# Patient Record
Sex: Female | Born: 1960 | Race: Black or African American | Hispanic: No | Marital: Married | Smoking: Never smoker
Health system: Southern US, Community
[De-identification: ages and names within clinical notes are randomized; demographics above are authoritative.]

## PROBLEM LIST (undated history)

## (undated) DIAGNOSIS — I5189 Other ill-defined heart diseases: Secondary | ICD-10-CM

## (undated) DIAGNOSIS — J3089 Other allergic rhinitis: Secondary | ICD-10-CM

## (undated) DIAGNOSIS — I10 Essential (primary) hypertension: Secondary | ICD-10-CM

## (undated) DIAGNOSIS — D763 Other histiocytosis syndromes: Secondary | ICD-10-CM

## (undated) DIAGNOSIS — E119 Type 2 diabetes mellitus without complications: Secondary | ICD-10-CM

## (undated) DIAGNOSIS — I517 Cardiomegaly: Secondary | ICD-10-CM

## (undated) DIAGNOSIS — D649 Anemia, unspecified: Secondary | ICD-10-CM

## (undated) DIAGNOSIS — L508 Other urticaria: Secondary | ICD-10-CM

## (undated) DIAGNOSIS — N926 Irregular menstruation, unspecified: Secondary | ICD-10-CM

## (undated) DIAGNOSIS — I480 Paroxysmal atrial fibrillation: Secondary | ICD-10-CM

## (undated) DIAGNOSIS — E041 Nontoxic single thyroid nodule: Secondary | ICD-10-CM

## (undated) DIAGNOSIS — E785 Hyperlipidemia, unspecified: Secondary | ICD-10-CM

## (undated) DIAGNOSIS — F329 Major depressive disorder, single episode, unspecified: Secondary | ICD-10-CM

## (undated) DIAGNOSIS — L509 Urticaria, unspecified: Secondary | ICD-10-CM

## (undated) DIAGNOSIS — R600 Localized edema: Secondary | ICD-10-CM

## (undated) DIAGNOSIS — Z87898 Personal history of other specified conditions: Secondary | ICD-10-CM

## (undated) DIAGNOSIS — I519 Heart disease, unspecified: Secondary | ICD-10-CM

## (undated) DIAGNOSIS — K219 Gastro-esophageal reflux disease without esophagitis: Secondary | ICD-10-CM

## (undated) DIAGNOSIS — E669 Obesity, unspecified: Secondary | ICD-10-CM

## (undated) DIAGNOSIS — F32A Depression, unspecified: Secondary | ICD-10-CM

## (undated) DIAGNOSIS — I509 Heart failure, unspecified: Secondary | ICD-10-CM

## (undated) DIAGNOSIS — D219 Benign neoplasm of connective and other soft tissue, unspecified: Secondary | ICD-10-CM

## (undated) DIAGNOSIS — N390 Urinary tract infection, site not specified: Secondary | ICD-10-CM

## (undated) HISTORY — DX: Urinary tract infection, site not specified: N39.0

## (undated) HISTORY — DX: Depression, unspecified: F32.A

## (undated) HISTORY — PX: MASS EXCISION: SHX2000

## (undated) HISTORY — DX: Irregular menstruation, unspecified: N92.6

## (undated) HISTORY — PX: TONSILLECTOMY AND ADENOIDECTOMY: SUR1326

## (undated) HISTORY — DX: Benign neoplasm of connective and other soft tissue, unspecified: D21.9

## (undated) HISTORY — PX: TUBAL LIGATION: SHX77

## (undated) HISTORY — DX: Major depressive disorder, single episode, unspecified: F32.9

## (undated) HISTORY — PX: COLONOSCOPY: SHX174

## (undated) HISTORY — DX: Essential (primary) hypertension: I10

---

## 1997-12-31 ENCOUNTER — Encounter: Admission: RE | Admit: 1997-12-31 | Discharge: 1998-03-31 | Payer: Self-pay | Admitting: Internal Medicine

## 1998-05-26 ENCOUNTER — Emergency Department (HOSPITAL_COMMUNITY): Admission: EM | Admit: 1998-05-26 | Discharge: 1998-05-26 | Payer: Self-pay | Admitting: Emergency Medicine

## 1998-05-26 ENCOUNTER — Encounter: Payer: Self-pay | Admitting: Emergency Medicine

## 1999-02-12 ENCOUNTER — Other Ambulatory Visit: Admission: RE | Admit: 1999-02-12 | Discharge: 1999-02-12 | Payer: Self-pay | Admitting: Obstetrics and Gynecology

## 1999-04-28 ENCOUNTER — Encounter: Admission: RE | Admit: 1999-04-28 | Discharge: 1999-07-27 | Payer: Self-pay | Admitting: Internal Medicine

## 1999-07-15 ENCOUNTER — Encounter: Payer: Self-pay | Admitting: Emergency Medicine

## 1999-07-15 ENCOUNTER — Emergency Department (HOSPITAL_COMMUNITY): Admission: EM | Admit: 1999-07-15 | Discharge: 1999-07-15 | Payer: Self-pay | Admitting: Emergency Medicine

## 1999-07-19 ENCOUNTER — Emergency Department (HOSPITAL_COMMUNITY): Admission: EM | Admit: 1999-07-19 | Discharge: 1999-07-19 | Payer: Self-pay | Admitting: Internal Medicine

## 1999-07-19 ENCOUNTER — Encounter: Payer: Self-pay | Admitting: Internal Medicine

## 1999-07-30 ENCOUNTER — Encounter: Admission: RE | Admit: 1999-07-30 | Discharge: 1999-08-17 | Payer: Self-pay | Admitting: Occupational Medicine

## 1999-08-21 ENCOUNTER — Encounter: Payer: Self-pay | Admitting: Gastroenterology

## 1999-08-21 ENCOUNTER — Ambulatory Visit (HOSPITAL_COMMUNITY): Admission: RE | Admit: 1999-08-21 | Discharge: 1999-08-21 | Payer: Self-pay | Admitting: Gastroenterology

## 1999-08-27 ENCOUNTER — Other Ambulatory Visit: Admission: RE | Admit: 1999-08-27 | Discharge: 1999-08-27 | Payer: Self-pay | Admitting: *Deleted

## 1999-09-10 ENCOUNTER — Ambulatory Visit (HOSPITAL_COMMUNITY): Admission: RE | Admit: 1999-09-10 | Discharge: 1999-09-10 | Payer: Self-pay | Admitting: *Deleted

## 1999-09-10 ENCOUNTER — Encounter: Payer: Self-pay | Admitting: *Deleted

## 1999-09-15 ENCOUNTER — Ambulatory Visit (HOSPITAL_COMMUNITY): Admission: RE | Admit: 1999-09-15 | Discharge: 1999-09-15 | Payer: Self-pay | Admitting: *Deleted

## 1999-09-15 ENCOUNTER — Encounter: Payer: Self-pay | Admitting: *Deleted

## 1999-09-28 ENCOUNTER — Encounter: Admission: RE | Admit: 1999-09-28 | Discharge: 1999-12-27 | Payer: Self-pay | Admitting: Internal Medicine

## 2000-09-05 ENCOUNTER — Other Ambulatory Visit: Admission: RE | Admit: 2000-09-05 | Discharge: 2000-09-05 | Payer: Self-pay | Admitting: Obstetrics and Gynecology

## 2000-09-27 ENCOUNTER — Encounter: Payer: Self-pay | Admitting: Gastroenterology

## 2000-09-27 ENCOUNTER — Ambulatory Visit (HOSPITAL_COMMUNITY): Admission: RE | Admit: 2000-09-27 | Discharge: 2000-09-27 | Payer: Self-pay | Admitting: Gastroenterology

## 2001-02-01 ENCOUNTER — Encounter: Payer: Self-pay | Admitting: Emergency Medicine

## 2001-02-01 ENCOUNTER — Emergency Department (HOSPITAL_COMMUNITY): Admission: EM | Admit: 2001-02-01 | Discharge: 2001-02-01 | Payer: Self-pay | Admitting: Emergency Medicine

## 2001-09-13 ENCOUNTER — Other Ambulatory Visit: Admission: RE | Admit: 2001-09-13 | Discharge: 2001-09-13 | Payer: Self-pay | Admitting: Obstetrics and Gynecology

## 2002-09-05 ENCOUNTER — Other Ambulatory Visit: Admission: RE | Admit: 2002-09-05 | Discharge: 2002-09-05 | Payer: Self-pay | Admitting: Obstetrics and Gynecology

## 2002-09-11 ENCOUNTER — Ambulatory Visit (HOSPITAL_COMMUNITY): Admission: RE | Admit: 2002-09-11 | Discharge: 2002-09-11 | Payer: Self-pay | Admitting: Obstetrics and Gynecology

## 2002-09-11 ENCOUNTER — Encounter: Payer: Self-pay | Admitting: Obstetrics and Gynecology

## 2003-10-15 ENCOUNTER — Other Ambulatory Visit: Admission: RE | Admit: 2003-10-15 | Discharge: 2003-10-15 | Payer: Self-pay | Admitting: Obstetrics and Gynecology

## 2003-10-30 ENCOUNTER — Ambulatory Visit (HOSPITAL_COMMUNITY): Admission: RE | Admit: 2003-10-30 | Discharge: 2003-10-30 | Payer: Self-pay | Admitting: Obstetrics and Gynecology

## 2004-07-30 ENCOUNTER — Emergency Department (HOSPITAL_COMMUNITY): Admission: EM | Admit: 2004-07-30 | Discharge: 2004-07-30 | Payer: Self-pay | Admitting: Family Medicine

## 2005-02-19 ENCOUNTER — Other Ambulatory Visit: Admission: RE | Admit: 2005-02-19 | Discharge: 2005-02-19 | Payer: Self-pay | Admitting: Obstetrics and Gynecology

## 2009-02-26 ENCOUNTER — Ambulatory Visit (HOSPITAL_COMMUNITY): Admission: RE | Admit: 2009-02-26 | Discharge: 2009-02-26 | Payer: Self-pay | Admitting: Family Medicine

## 2010-08-16 ENCOUNTER — Encounter: Payer: Self-pay | Admitting: Obstetrics and Gynecology

## 2011-04-09 ENCOUNTER — Ambulatory Visit (HOSPITAL_COMMUNITY)
Admission: RE | Admit: 2011-04-09 | Discharge: 2011-04-09 | Disposition: A | Discharge status: Home / Self Care | Payer: 59 | Source: Ambulatory Visit | Attending: Gastroenterology | Admitting: Gastroenterology

## 2011-04-09 DIAGNOSIS — K625 Hemorrhage of anus and rectum: Secondary | ICD-10-CM | POA: Insufficient documentation

## 2011-04-09 DIAGNOSIS — I10 Essential (primary) hypertension: Secondary | ICD-10-CM | POA: Insufficient documentation

## 2011-04-09 DIAGNOSIS — E119 Type 2 diabetes mellitus without complications: Secondary | ICD-10-CM | POA: Insufficient documentation

## 2011-04-09 DIAGNOSIS — K648 Other hemorrhoids: Secondary | ICD-10-CM | POA: Insufficient documentation

## 2012-02-25 ENCOUNTER — Encounter: Payer: Self-pay | Admitting: Obstetrics and Gynecology

## 2012-02-29 ENCOUNTER — Ambulatory Visit: Payer: Self-pay | Admitting: Obstetrics and Gynecology

## 2012-03-22 ENCOUNTER — Encounter: Payer: Self-pay | Admitting: Obstetrics and Gynecology

## 2012-03-22 ENCOUNTER — Ambulatory Visit (INDEPENDENT_AMBULATORY_CARE_PROVIDER_SITE_OTHER): Payer: 59 | Admitting: Obstetrics and Gynecology

## 2012-03-22 VITALS — BP 144/94 | Resp 16 | Ht 62.0 in

## 2012-03-22 DIAGNOSIS — Z01419 Encounter for gynecological examination (general) (routine) without abnormal findings: Secondary | ICD-10-CM

## 2012-03-22 DIAGNOSIS — N950 Postmenopausal bleeding: Secondary | ICD-10-CM

## 2012-03-22 DIAGNOSIS — Z124 Encounter for screening for malignant neoplasm of cervix: Secondary | ICD-10-CM

## 2012-03-22 NOTE — Progress Notes (Signed)
Regular Periods: no Mammogram: no  Monthly Breast Ex.: yes Exercise: yes  Tetanus < 10 years: yes Seatbelts: yes  NI. Bladder Functn.: yes Abuse at home: no  Daily BM's: yes Stressful Work: no  Healthy Diet: yes Sigmoid-Colonoscopy: "2011" WNL  Calcium: yes Medical problems this year: pt she has not had a cycle in 1 yr then yesterday she had 1 episode of bleeding. Pt is not bleeding anymore.   LAST PAP:09/2010  Contraception: BTL  Mammogram:  2010  ZOX:WRUEAVW Faulk , MD at [facility]   PMH: No Changes  FMH: No Changes  Last Bone Scan: per pt she does not remember.   Subjective:    Cynthia Walsh is a 51 y.o. female [phone] who presents for annual exam. The patient complains of postmenopausal bleeding. She has gone one year without a period.  She has hot flashes.  She is having trouble losing weight.  The following portions of the patient's history were reviewed and updated as appropriate: allergies, current medications, past family history, past medical history, past social history, past surgical history and problem list. See above.  Review of Systems Pertinent items are noted in HPI. Gastrointestinal:No change in bowel habits, no abdominal pain, no rectal bleeding Genitourinary:negative for dysuria, frequency, hematuria, nocturia and urinary incontinence    Objective:     BP 144/94  Resp 16  Ht 5\' 2"  (1.575 m)  Weight:  Wt Readings from Last 1 Encounters:  No data found for Wt     BMI: There is no weight on file to calculate BMI. General Appearance: Alert, appropriate appearance for age. No acute distress HEENT: Grossly normal Neck / Thyroid: Supple, no masses, nodes or enlargement Lungs: clear to auscultation bilaterally Back: No CVA tenderness Breast Exam: No masses or nodes.No dimpling, nipple retraction or discharge. Cardiovascular: Regular rate and rhythm. S1, S2, no murmur Gastrointestinal: Soft, non-tender, no masses or  organomegaly  ++++++++++++++++++++++++++++++++++++++++++++++++++++++++  Pelvic Exam: External genitalia: normal general appearance Vaginal: normal without tenderness, induration or masses and relaxation noted Cervix: normal appearance Adnexa: normal bimanual exam Uterus: normal single, nontender Rectovaginal: normal rectal, no masses  ++++++++++++++++++++++++++++++++++++++++++++++++++++++++  Lymphatic Exam: Non-palpable nodes in neck, clavicular, axillary, or inguinal regions  Psychiatric: Alert and oriented, appropriate affect.    Urinalysis:Not done      Assessment:    Normal gyn exam Post-menopausal vaginal bleeding   Overweight or obese: Yes  Pelvic relaxation: Yes  Menopausal symptoms: Yes. Severe: No.   Plan:    Mammogram. Pap smear.   Hydro-sonogram next visit and endometrial biopsy.  Follow-up:  4 weeks  STD screen request: GC, chlamydia,   The updated Pap smear screening guidelines were discussed with the patient. The patient requested that I obtain a Pap smear: Yes.  Kegel exercises discussed: Yes.  Proper diet and regular exercise were reviewed.  Annual mammograms recommended starting at age 31. Proper breast care was discussed.  Screening colonoscopy is recommended beginning at age 67.  Regular health maintenance was reviewed.  Sleep hygiene was discussed.  Adequate calcium and vitamin D intake was emphasized.  Cynthia Walsh.D.

## 2012-03-24 LAB — PAP IG, CT-NG, RFX HPV ASCU: Chlamydia Probe Amp: NEGATIVE

## 2012-04-19 ENCOUNTER — Ambulatory Visit (INDEPENDENT_AMBULATORY_CARE_PROVIDER_SITE_OTHER): Payer: 59 | Admitting: Obstetrics and Gynecology

## 2012-04-19 ENCOUNTER — Ambulatory Visit (INDEPENDENT_AMBULATORY_CARE_PROVIDER_SITE_OTHER): Payer: 59

## 2012-04-19 ENCOUNTER — Encounter: Payer: Self-pay | Admitting: Obstetrics and Gynecology

## 2012-04-19 ENCOUNTER — Other Ambulatory Visit: Payer: Self-pay | Admitting: Obstetrics and Gynecology

## 2012-04-19 VITALS — BP 142/94

## 2012-04-19 DIAGNOSIS — N898 Other specified noninflammatory disorders of vagina: Secondary | ICD-10-CM

## 2012-04-19 DIAGNOSIS — N950 Postmenopausal bleeding: Secondary | ICD-10-CM

## 2012-04-19 DIAGNOSIS — N939 Abnormal uterine and vaginal bleeding, unspecified: Secondary | ICD-10-CM

## 2012-04-19 DIAGNOSIS — N946 Dysmenorrhea, unspecified: Secondary | ICD-10-CM

## 2012-04-19 MED ORDER — IBUPROFEN 800 MG PO TABS: 800.0000 mg | ORAL_TABLET | Freq: Three times a day (TID) | ORAL | Status: DC | PRN

## 2012-04-19 NOTE — Progress Notes (Signed)
HISTORY OF PRESENT ILLNESS  Ms. Cynthia Walsh is a 51 y.o. year old female,G4P1031, who presents for a problem visit. The patient has a known history of fibroids.  Subjective:  She complains of postmenopausal bleeding.  Objective:  BP 142/94   General: no distress GI: soft and nontender  External genitalia: normal general appearance Vaginal: normal without tenderness, induration or masses and relaxation noted Cervix: normal appearance Adnexa: normal bimanual exam Uterus: nontender, difficult to outline because of obesity  Hydrosonogram:  Indications for the procedure were reviewed.  A permit has been signed.  Questions were answered. An ultrasound was performed.  The uterus measured 12.5 cm x 9.69 cm.  The endometrial thickness was 7.21 mm.  The ovaries : Left is normal, right not seen.  The vagina and cervix were prepped with Betadine.  Hurricaine gel was placed on the cervix.  A single-tooth tenaculum was used.  We attempted to sound the uterus for an endometrial biopsy.  The patient was uncomfortable and pelvic relaxation made the procedure difficult.  We were able to obtain a sample but I am not certain that it came from the endometrium. The hydrosonogram catheter was placed inside the uterus.  20 cc of sterile saline were injected.  A 3-D ultrasound was performed. Findings include: a posterior fibroid measuring 3.9 cm, 2 lesions consistent with polyps (2.5 and 1.0 cm).  The patient tolerated her procedure well.  All instruments were removed.  The patient was returned to the supine position.   Assessment:  Postmenopausal bleeding Fibroid uterus Endometrial polyps  Plan:  Management options were reviewed.  Risk and benefits were discussed.  The patient wants to proceed with hysteroscopy with dilatation and curettage. We will schedule. Endometrial biopsy to pathology. Ibuprofen 800 mg every 8 hours as needed for pain.  Return to office prn if symptoms worsen or fail to  improve.   Leonard Schwartz M.D.  04/19/2012 6:50 PM    When did bleeding start: started last month. How  Long: still bleeding  How often changing pad/tampon: not much maybe once-twice a day Bleeding Disorders: no Cramping: yes Contraception: no Fibroids: yes Hormone Therapy: no New Medications: yes Menopausal Symptoms: yes Vag. Discharge: no Abdominal Pain: yes "Only when its around the time for her cycle "would have been her cycle" Increased Stress: no

## 2012-04-21 LAB — PATHOLOGY

## 2012-04-24 ENCOUNTER — Telehealth: Payer: Self-pay | Admitting: Obstetrics and Gynecology

## 2012-04-24 NOTE — Telephone Encounter (Signed)
The patient called.  Told that biopsy showed benign endocervical cells.  Dr. Stefano Gaul

## 2012-05-10 ENCOUNTER — Telehealth: Payer: Self-pay | Admitting: Obstetrics and Gynecology

## 2012-05-10 NOTE — Telephone Encounter (Signed)
Hysteroscopy with Resection; D&C scheduled for 06/16/12 @ 11:00 with AVS.  UMR effective 07/27/11.  Plan pays 65/35 after a $400 deductible. Pre-cert not required. Pre-op due $81.49. -Adrianne Pridgen

## 2012-05-10 NOTE — Telephone Encounter (Signed)
Hysteroscopy with Resection; D&C rescheduled to 06/16/12 @ 1:00 with AVS.  UMR effective 07/27/11.  Plan pays 65/35 after a $400 deductible. Pre-cert not required. Pre-op due $81.49. -Adrianne Pridgen

## 2012-05-12 ENCOUNTER — Other Ambulatory Visit: Payer: Self-pay | Admitting: Obstetrics and Gynecology

## 2012-06-03 ENCOUNTER — Encounter (HOSPITAL_COMMUNITY): Payer: Self-pay | Admitting: Pharmacist

## 2012-06-14 ENCOUNTER — Telehealth: Payer: Self-pay | Admitting: Obstetrics and Gynecology

## 2012-06-14 ENCOUNTER — Encounter (HOSPITAL_COMMUNITY)
Admission: RE | Admit: 2012-06-14 | Discharge: 2012-06-14 | Disposition: A | Discharge status: Home / Self Care | Payer: 59 | Source: Ambulatory Visit | Attending: Obstetrics and Gynecology | Admitting: Obstetrics and Gynecology

## 2012-06-14 ENCOUNTER — Encounter (HOSPITAL_COMMUNITY): Payer: Self-pay

## 2012-06-14 DIAGNOSIS — Z01812 Encounter for preprocedural laboratory examination: Secondary | ICD-10-CM | POA: Insufficient documentation

## 2012-06-14 DIAGNOSIS — Z01818 Encounter for other preprocedural examination: Secondary | ICD-10-CM | POA: Insufficient documentation

## 2012-06-14 HISTORY — DX: Urticaria, unspecified: L50.9

## 2012-06-14 LAB — CBC
Platelets: 312 10*3/uL (ref 150–400)
RBC: 5.06 MIL/uL (ref 3.87–5.11)
RDW: 13.9 % (ref 11.5–15.5)
WBC: 6.2 10*3/uL (ref 4.0–10.5)

## 2012-06-14 LAB — BASIC METABOLIC PANEL
CO2: 28 mEq/L (ref 19–32)
Chloride: 99 mEq/L (ref 96–112)
Creatinine, Ser: 0.87 mg/dL (ref 0.50–1.10)
GFR calc Af Amer: 87 mL/min — ABNORMAL LOW (ref >90)
Potassium: 3.6 mEq/L (ref 3.5–5.1)
Sodium: 135 mEq/L (ref 135–145)

## 2012-06-14 NOTE — Pre-Procedure Instructions (Signed)
Dr. Malen Gauze notified of blood glucose of 226, call to Adrianne /MD office to notify Dr Stefano Gaul.

## 2012-06-14 NOTE — Patient Instructions (Addendum)
20 SOPHEA RACKHAM  06/14/2012   Your procedure is scheduled on:  06/16/12  Enter through the Main Entrance of [facility] at 1130 AM.  Pick up the phone at the desk and dial [phone].   Call this number if you have problems the morning of surgery: [phone]   Remember:   Do not eat food:after midnight  Do not drink clear liquids: 4 Hours before arrival.  Take these medicines the morning of surgery with A SIP OF WATER: NA   Do not wear jewelry, make-up or nail polish.  Do not wear lotions, powders, or perfumes. You may wear deodorant.  Do not shave 48 hours prior to surgery.  Do not bring valuables to the hospital.  Contacts, dentures or bridgework may not be worn into surgery.  Leave suitcase in the car. After surgery it may be brought to your room.  For patients admitted to the hospital, checkout time is 11:00 AM the day of discharge.   Patients discharged the day of surgery will not be allowed to drive home.  Name and phone number of your driver: husband or sister  Special Instructions: Shower using CHG 2 nights before surgery and the night before surgery.  If you shower the day of surgery use CHG.  Use special wash - you have one bottle of CHG for all showers.  You should use approximately 1/3 of the bottle for each shower.   Please read over the following fact sheets that you were given: Surgical Site Infection Prevention

## 2012-06-14 NOTE — Pre-Procedure Instructions (Signed)
EKG reviewed by Dr. Malen Gauze, patient to have cardiac clearance prior to surgery. Pt made aware, Dr. Debria Garret office notified Merita Norton).

## 2012-06-15 ENCOUNTER — Other Ambulatory Visit (HOSPITAL_COMMUNITY): Payer: Self-pay | Admitting: Cardiology

## 2012-06-15 DIAGNOSIS — Z01818 Encounter for other preprocedural examination: Secondary | ICD-10-CM

## 2012-06-16 ENCOUNTER — Ambulatory Visit (HOSPITAL_COMMUNITY): Admission: RE | Admit: 2012-06-16 | Payer: 59 | Source: Ambulatory Visit | Admitting: Obstetrics and Gynecology

## 2012-06-16 ENCOUNTER — Other Ambulatory Visit (HOSPITAL_COMMUNITY): Payer: Self-pay | Admitting: *Deleted

## 2012-06-16 ENCOUNTER — Encounter (HOSPITAL_COMMUNITY): Admission: RE | Payer: Self-pay | Source: Ambulatory Visit

## 2012-06-16 DIAGNOSIS — R9431 Abnormal electrocardiogram [ECG] [EKG]: Secondary | ICD-10-CM

## 2012-06-16 DIAGNOSIS — R011 Cardiac murmur, unspecified: Secondary | ICD-10-CM

## 2012-06-16 SURGERY — DILATATION & CURETTAGE/HYSTEROSCOPY WITH RESECTOCOPE
Anesthesia: Choice

## 2012-06-27 ENCOUNTER — Encounter (HOSPITAL_COMMUNITY)
Admission: RE | Admit: 2012-06-27 | Discharge: 2012-06-27 | Disposition: A | Discharge status: Home / Self Care | Payer: 59 | Source: Ambulatory Visit | Attending: Cardiology | Admitting: Cardiology

## 2012-06-27 VITALS — BP 178/90

## 2012-06-27 DIAGNOSIS — Z01818 Encounter for other preprocedural examination: Secondary | ICD-10-CM

## 2012-06-27 DIAGNOSIS — R9431 Abnormal electrocardiogram [ECG] [EKG]: Secondary | ICD-10-CM | POA: Diagnosis present

## 2012-06-28 ENCOUNTER — Encounter (HOSPITAL_COMMUNITY)
Admission: RE | Admit: 2012-06-28 | Discharge: 2012-06-28 | Disposition: A | Discharge status: Home / Self Care | Payer: 59 | Source: Ambulatory Visit | Attending: Cardiology | Admitting: Cardiology

## 2012-06-28 DIAGNOSIS — Z01818 Encounter for other preprocedural examination: Secondary | ICD-10-CM | POA: Insufficient documentation

## 2012-06-28 DIAGNOSIS — R9431 Abnormal electrocardiogram [ECG] [EKG]: Secondary | ICD-10-CM | POA: Insufficient documentation

## 2012-06-28 DIAGNOSIS — R011 Cardiac murmur, unspecified: Secondary | ICD-10-CM | POA: Insufficient documentation

## 2012-06-28 MED ORDER — TECHNETIUM TC 99M SESTAMIBI GENERIC - CARDIOLITE
30.0000 | Freq: Once | INTRAVENOUS | Status: AC | PRN
  Administered 2012-06-28: 30 via INTRAVENOUS

## 2012-06-28 MED ORDER — TECHNETIUM TC 99M SESTAMIBI - CARDIOLITE
30.0000 | Freq: Once | INTRAVENOUS | Status: AC | PRN
  Administered 2012-06-27: 10:00:00 30 via INTRAVENOUS

## 2012-06-29 ENCOUNTER — Ambulatory Visit (HOSPITAL_COMMUNITY)
Admission: RE | Admit: 2012-06-29 | Discharge: 2012-06-29 | Disposition: A | Discharge status: Home / Self Care | Payer: 59 | Source: Ambulatory Visit | Attending: Cardiology | Admitting: Cardiology

## 2012-06-29 ENCOUNTER — Encounter: Payer: 59 | Admitting: Obstetrics and Gynecology

## 2012-06-29 DIAGNOSIS — I10 Essential (primary) hypertension: Secondary | ICD-10-CM | POA: Insufficient documentation

## 2012-06-29 DIAGNOSIS — R011 Cardiac murmur, unspecified: Secondary | ICD-10-CM | POA: Insufficient documentation

## 2012-06-29 DIAGNOSIS — R9431 Abnormal electrocardiogram [ECG] [EKG]: Secondary | ICD-10-CM

## 2012-06-29 NOTE — Progress Notes (Signed)
  Echocardiogram 2D Echocardiogram has been performed.  Cynthia Walsh 06/29/2012, 2:41 PM

## 2012-07-04 ENCOUNTER — Encounter: Payer: Self-pay | Admitting: Obstetrics and Gynecology

## 2012-08-02 ENCOUNTER — Other Ambulatory Visit (HOSPITAL_COMMUNITY): Payer: Self-pay | Admitting: Family Medicine

## 2012-08-02 DIAGNOSIS — Z1231 Encounter for screening mammogram for malignant neoplasm of breast: Secondary | ICD-10-CM

## 2012-08-30 ENCOUNTER — Ambulatory Visit (HOSPITAL_COMMUNITY)
Admission: RE | Admit: 2012-08-30 | Discharge: 2012-08-30 | Disposition: A | Discharge status: Home / Self Care | Payer: 59 | Source: Ambulatory Visit | Attending: Family Medicine | Admitting: Family Medicine

## 2012-08-30 DIAGNOSIS — Z780 Asymptomatic menopausal state: Secondary | ICD-10-CM | POA: Insufficient documentation

## 2012-08-30 DIAGNOSIS — Z1231 Encounter for screening mammogram for malignant neoplasm of breast: Secondary | ICD-10-CM | POA: Insufficient documentation

## 2012-08-30 DIAGNOSIS — Z13820 Encounter for screening for osteoporosis: Secondary | ICD-10-CM | POA: Insufficient documentation

## 2012-08-31 ENCOUNTER — Other Ambulatory Visit: Payer: Self-pay | Admitting: Family Medicine

## 2012-08-31 DIAGNOSIS — R928 Other abnormal and inconclusive findings on diagnostic imaging of breast: Secondary | ICD-10-CM

## 2012-09-08 ENCOUNTER — Ambulatory Visit (HOSPITAL_BASED_OUTPATIENT_CLINIC_OR_DEPARTMENT_OTHER): Payer: 59 | Attending: Family Medicine

## 2012-09-08 VITALS — Ht 62.0 in | Wt 225.0 lb

## 2012-09-08 DIAGNOSIS — G4733 Obstructive sleep apnea (adult) (pediatric): Secondary | ICD-10-CM

## 2012-09-08 DIAGNOSIS — G4730 Sleep apnea, unspecified: Secondary | ICD-10-CM | POA: Insufficient documentation

## 2012-09-08 DIAGNOSIS — G4710 Hypersomnia, unspecified: Secondary | ICD-10-CM | POA: Insufficient documentation

## 2012-09-09 DIAGNOSIS — R0609 Other forms of dyspnea: Secondary | ICD-10-CM

## 2012-09-09 DIAGNOSIS — G4710 Hypersomnia, unspecified: Secondary | ICD-10-CM

## 2012-09-09 DIAGNOSIS — R0989 Other specified symptoms and signs involving the circulatory and respiratory systems: Secondary | ICD-10-CM

## 2012-09-09 DIAGNOSIS — G4730 Sleep apnea, unspecified: Secondary | ICD-10-CM

## 2012-09-12 NOTE — Procedures (Signed)
NAME:  Cynthia Walsh, Cynthia Walsh             ACCOUNT NO.:  [id number]  MEDICAL RECORD NO.:  [id number]          PATIENT TYPE:  OUT  LOCATION:  SLEEP CENTER                 FACILITY:  [facility]  PHYSICIAN:  Clinton D. Maple Hudson, MD, FCCP, FACPDATE OF BIRTH:  07-03-[birth year]  DATE OF STUDY:  09/08/2012                           NOCTURNAL POLYSOMNOGRAM  REFERRING PHYSICIAN:  Cammie Fulp, MD  INDICATION FOR STUDY:  Hypersomnia with sleep apnea.  EPWORTH SLEEPINESS SCORE:  2/24.  BMI 41.2, weight 225 pounds, height 62 inches, neck 14 inches.  MEDICATIONS:  Home medications are charted and reviewed.  SLEEP ARCHITECTURE:  Total sleep time 324 minutes with sleep efficiency 81.7%.  Stage I was 5.9%, stage II 90.9%, stage III 3.2%, REM absent. Sleep latency 5 minutes, awake after sleep onset 50 minutes, arousal index 7.4.  BEDTIME MEDICATION:  None.  RESPIRATORY DATA:  Apnea-hypopnea index (AHI) 4.6 per hour.  A total of 25 events was scored including 11 obstructive apneas and 14 hypopneas. Most events were nonsupine.  There were not enough events to meet protocol requirements for CPAP titration on this night.  OXYGEN DATA:  Loud snoring with oxygen desaturation to a nadir of 77% and mean oxygen saturation through the study of 92.7% on room air.  CARDIAC DATA:  Normal sinus rhythm.  MOVEMENT-PARASOMNIA:  No significant movement disturbance.  No bathroom trips.  IMPRESSIONS-RECOMMENDATIONS: 1. Sleep architecture, remarkable for absence of REM, which is a     nonspecific finding on a single night in an unfamiliar environment.     No bedtime medication was taken. 2. Occasional respiratory events with sleep disturbance, within normal     limits.  AHI 4.6 per hour (the normal range for adults is from 0-5     events per hour).  Loud snoring with oxygen desaturation to a nadir     of 77% and mean oxygen saturation through the study of 92.7% on     room air. 3. Apnea events were tightly clustered between 3 and  4 a.m.  It may be     that intervals with sleep apnea are being noted in the home     environment, but sustained apneic respiratory pattern was not seen     on the study night.     Therapeutic emphasis might be placed on weight loss and treatment     for any significant nasal or upper airway obstruction, such as     rhinitis, if appropriate.     Clinton D. Maple Hudson, MD, [facility], FACP Diplomate, American Board of Sleep Medicine    CDY/MEDQ  D:  09/09/2012 10:35:53  T:  09/09/2012 21:26:05  Job:  [id number]

## 2012-09-13 ENCOUNTER — Ambulatory Visit
Admission: RE | Admit: 2012-09-13 | Discharge: 2012-09-13 | Disposition: A | Discharge status: Home / Self Care | Payer: 59 | Source: Ambulatory Visit | Attending: Family Medicine | Admitting: Family Medicine

## 2012-09-13 DIAGNOSIS — R928 Other abnormal and inconclusive findings on diagnostic imaging of breast: Secondary | ICD-10-CM

## 2012-09-15 ENCOUNTER — Encounter (HOSPITAL_BASED_OUTPATIENT_CLINIC_OR_DEPARTMENT_OTHER): Payer: 59

## 2013-05-10 ENCOUNTER — Other Ambulatory Visit: Payer: Self-pay | Admitting: Obstetrics and Gynecology

## 2013-06-04 ENCOUNTER — Encounter (HOSPITAL_COMMUNITY): Payer: Self-pay | Admitting: Pharmacist

## 2013-06-12 ENCOUNTER — Encounter (HOSPITAL_COMMUNITY)
Admission: RE | Admit: 2013-06-12 | Discharge: 2013-06-12 | Disposition: A | Discharge status: Home / Self Care | Payer: 59 | Source: Ambulatory Visit | Attending: Obstetrics and Gynecology | Admitting: Obstetrics and Gynecology

## 2013-06-12 ENCOUNTER — Encounter (HOSPITAL_COMMUNITY): Payer: Self-pay

## 2013-06-12 LAB — CBC
Hemoglobin: 12.9 g/dL (ref 12.0–15.0)
MCH: 25.9 pg — ABNORMAL LOW (ref 26.0–34.0)
MCHC: 33.4 g/dL (ref 30.0–36.0)
MCV: 77.4 fL — ABNORMAL LOW (ref 78.0–100.0)
Platelets: 307 10*3/uL (ref 150–400)

## 2013-06-12 NOTE — Patient Instructions (Signed)
20 Cynthia Walsh  06/12/2013   Your procedure is scheduled on:  06/15/13  Enter through the Main Entrance of [facility] at 730 AM.  Pick up the phone at the desk and dial [phone].   Call this number if you have problems the morning of surgery: [phone]   Remember:   Do not eat food:After Midnight.  Do not drink clear liquids: After Midnight.  Take these medicines the morning of surgery with A SIP OF WATER: Blood pressure medication, hold Metformin 24hrs prior to surgery   Do not wear jewelry, make-up or nail polish.  Do not wear lotions, powders, or perfumes. You may wear deodorant.  Do not shave 48 hours prior to surgery.  Do not bring valuables to the hospital.  [facility] is not   responsible for any belongings or valuables brought to the hospital.  Contacts, dentures or bridgework may not be worn into surgery.  Leave suitcase in the car. After surgery it may be brought to your room.  For patients admitted to the hospital, checkout time is 11:00 AM the day of              discharge.   Patients discharged the day of surgery will not be allowed to drive             home.  Name and phone number of your driver: Georga Hacking  spouse  Special Instructions:   Shower using CHG 2 nights before surgery and the night before surgery.  If you shower the day of surgery use CHG.  Use special wash - you have one bottle of CHG for all showers.  You should use approximately 1/3 of the bottle for each shower.   Please read over the following fact sheets that you were given:   Surgical Site Infection Prevention

## 2013-06-12 NOTE — Pre-Procedure Instructions (Signed)
Pt  Hx and EKG reviewed by Sherron Ales, MD. No orders given.

## 2013-06-14 NOTE — H&P (Signed)
  Admission History and Physical Exam for a Gynecology Patient  Ms. MYLEIGH AMARA is a 52 y.o. female, [phone], who presents for hysteroscopy with dilatation and curettage.  The patient has a history of postmenopausal bleeding.  She was scheduled for the same procedure one year ago.  She was noted to have EKG changes in her procedure was canceled.  The patient did not followup in our office until recently. She has been followed at the [facility] and Gynecology division of [facility].  OB History   Grav Para Term Preterm Abortions TAB SAB Ect Mult Living   4 1 1  3  3   1       Past Medical History  Diagnosis Date  . Depression   . Fibroid   . Hypertension   . Irregular bleeding   . UTI (urinary tract infection)     history of UTI   . Full body hives     hx 2012    No prescriptions prior to admission    Past Surgical History  Procedure Laterality Date  . Tubal ligation      Allergies  Allergen Reactions  . Ceclor [Cefaclor] Nausea And Vomiting  . Sulfa Drugs Cross Reactors Itching, Swelling and Rash  . Fruit & Vegetable Daily [Nutritional Supplements] Rash    Family History: family history includes Alzheimer's disease in her father and mother.  Social History:  reports that she has never smoked. She has never used smokeless tobacco. She reports that she does not drink alcohol or use illicit drugs.  Review of systems: See HPI.  Admission Physical Exam:    BMI = 47.  There were no vitals taken for this visit.  HEENT:                 Within normal limits Chest:                   Clear Heart:                    Regular rate and rhythm Breasts:                No masses, skin changes, bleeding, or discharge present Abdomen:             Nontender, no masses Extremities:          Grossly normal Neurologic exam: Grossly normal  Pelvic exam:  External genitalia: normal general appearance Vaginal: normal without tenderness,  induration or masses Cervix: normal appearance Adnexa: normal bimanual exam Uterus: difficult to outline Rectal: no masses  Assessment:  Postmenopausal bleeding Hypertension Obesity (BMI 47) Depression Fibroids  Plan:  The patient will undergo hysteroscopy with dilatation and curettage.  She understands the indications for surgical procedure.  She accepts the risk of, but not limited to, anesthetic complications, bleeding, infections, and possible damage to the surrounding organs.   Janine Limbo 06/14/2013

## 2013-06-15 ENCOUNTER — Ambulatory Visit (HOSPITAL_COMMUNITY)
Admission: RE | Admit: 2013-06-15 | Discharge: 2013-06-15 | Disposition: A | Discharge status: Home / Self Care | Payer: 59 | Source: Ambulatory Visit | Attending: Obstetrics and Gynecology | Admitting: Obstetrics and Gynecology

## 2013-06-15 ENCOUNTER — Encounter (HOSPITAL_COMMUNITY)
Admission: RE | Disposition: A | Discharge status: Home / Self Care | Payer: Self-pay | Source: Ambulatory Visit | Attending: Obstetrics and Gynecology

## 2013-06-15 ENCOUNTER — Encounter (HOSPITAL_COMMUNITY): Payer: 59 | Admitting: Anesthesiology

## 2013-06-15 ENCOUNTER — Ambulatory Visit (HOSPITAL_COMMUNITY): Payer: 59 | Admitting: Anesthesiology

## 2013-06-15 ENCOUNTER — Encounter (HOSPITAL_COMMUNITY): Payer: Self-pay

## 2013-06-15 DIAGNOSIS — D259 Leiomyoma of uterus, unspecified: Secondary | ICD-10-CM | POA: Insufficient documentation

## 2013-06-15 DIAGNOSIS — I10 Essential (primary) hypertension: Secondary | ICD-10-CM | POA: Insufficient documentation

## 2013-06-15 DIAGNOSIS — E669 Obesity, unspecified: Secondary | ICD-10-CM | POA: Insufficient documentation

## 2013-06-15 DIAGNOSIS — N950 Postmenopausal bleeding: Secondary | ICD-10-CM | POA: Insufficient documentation

## 2013-06-15 DIAGNOSIS — E119 Type 2 diabetes mellitus without complications: Secondary | ICD-10-CM | POA: Insufficient documentation

## 2013-06-15 DIAGNOSIS — F329 Major depressive disorder, single episode, unspecified: Secondary | ICD-10-CM | POA: Insufficient documentation

## 2013-06-15 DIAGNOSIS — F3289 Other specified depressive episodes: Secondary | ICD-10-CM | POA: Insufficient documentation

## 2013-06-15 DIAGNOSIS — N840 Polyp of corpus uteri: Secondary | ICD-10-CM | POA: Insufficient documentation

## 2013-06-15 DIAGNOSIS — Z6841 Body Mass Index (BMI) 40.0 and over, adult: Secondary | ICD-10-CM | POA: Insufficient documentation

## 2013-06-15 HISTORY — DX: Type 2 diabetes mellitus without complications: E11.9

## 2013-06-15 HISTORY — PX: DILATATION & CURRETTAGE/HYSTEROSCOPY WITH RESECTOCOPE: SHX5572

## 2013-06-15 LAB — BASIC METABOLIC PANEL
Calcium: 9.3 mg/dL (ref 8.4–10.5)
Creatinine, Ser: 0.8 mg/dL (ref 0.50–1.10)
GFR calc non Af Amer: 83 mL/min — ABNORMAL LOW (ref >90)
Glucose, Bld: 172 mg/dL — ABNORMAL HIGH (ref 70–99)
Sodium: 137 mEq/L (ref 135–145)

## 2013-06-15 LAB — GLUCOSE, CAPILLARY
Glucose-Capillary: 165 mg/dL — ABNORMAL HIGH (ref 70–99)
Glucose-Capillary: 149 mg/dL — ABNORMAL HIGH (ref 70–99)

## 2013-06-15 SURGERY — DILATATION & CURETTAGE/HYSTEROSCOPY WITH RESECTOCOPE
Anesthesia: General

## 2013-06-15 MED ORDER — KETOROLAC TROMETHAMINE 30 MG/ML IJ SOLN
INTRAMUSCULAR | Status: DC | PRN
  Administered 2013-06-15: 30 mg via INTRAVENOUS
  Administered 2013-06-15: 30 mg via INTRAMUSCULAR

## 2013-06-15 MED ORDER — BUPIVACAINE-EPINEPHRINE (PF) 0.5% -1:200000 IJ SOLN
INTRAMUSCULAR | Status: AC
  Filled 2013-06-15: qty 10

## 2013-06-15 MED ORDER — METOCLOPRAMIDE HCL 5 MG/ML IJ SOLN: 10.0000 mg | Freq: Once | INTRAMUSCULAR | Status: DC | PRN

## 2013-06-15 MED ORDER — GLYCINE 1.5 % IR SOLN
Status: DC | PRN
  Administered 2013-06-15: 3000 mL

## 2013-06-15 MED ORDER — ONDANSETRON HCL 4 MG/2ML IJ SOLN
INTRAMUSCULAR | Status: AC
  Filled 2013-06-15: qty 2

## 2013-06-15 MED ORDER — ONDANSETRON HCL 4 MG/2ML IJ SOLN
INTRAMUSCULAR | Status: DC | PRN
  Administered 2013-06-15: 4 mg via INTRAVENOUS

## 2013-06-15 MED ORDER — LACTATED RINGERS IV SOLN
INTRAVENOUS | Status: DC
  Administered 2013-06-15: 50 mL/h via INTRAVENOUS

## 2013-06-15 MED ORDER — LIDOCAINE HCL (CARDIAC) 20 MG/ML IV SOLN
INTRAVENOUS | Status: DC | PRN
  Administered 2013-06-15: 30 mg via INTRAVENOUS

## 2013-06-15 MED ORDER — LIDOCAINE HCL (CARDIAC) 20 MG/ML IV SOLN
INTRAVENOUS | Status: AC
Start: 2013-06-15 — End: 2013-06-15
  Filled 2013-06-15: qty 5

## 2013-06-15 MED ORDER — PROPOFOL 10 MG/ML IV BOLUS
INTRAVENOUS | Status: DC | PRN
  Administered 2013-06-15: 170 mg via INTRAVENOUS
  Administered 2013-06-15: 80 mg via INTRAVENOUS

## 2013-06-15 MED ORDER — KETOROLAC TROMETHAMINE 30 MG/ML IJ SOLN: 15.0000 mg | Freq: Once | INTRAMUSCULAR | Status: DC | PRN

## 2013-06-15 MED ORDER — PROPOFOL 10 MG/ML IV EMUL
INTRAVENOUS | Status: AC
  Filled 2013-06-15: qty 40

## 2013-06-15 MED ORDER — FENTANYL CITRATE 0.05 MG/ML IJ SOLN: 25.0000 ug | INTRAMUSCULAR | Status: DC | PRN

## 2013-06-15 MED ORDER — MEPERIDINE HCL 25 MG/ML IJ SOLN: 6.2500 mg | INTRAMUSCULAR | Status: DC | PRN

## 2013-06-15 MED ORDER — FENTANYL CITRATE 0.05 MG/ML IJ SOLN
INTRAMUSCULAR | Status: AC
  Filled 2013-06-15: qty 2

## 2013-06-15 MED ORDER — BUPIVACAINE-EPINEPHRINE 0.5% -1:200000 IJ SOLN
INTRAMUSCULAR | Status: DC | PRN
  Administered 2013-06-15: 10 mL

## 2013-06-15 MED ORDER — KETOROLAC TROMETHAMINE 30 MG/ML IJ SOLN
INTRAMUSCULAR | Status: AC
  Filled 2013-06-15: qty 2

## 2013-06-15 MED ORDER — FENTANYL CITRATE 0.05 MG/ML IJ SOLN
INTRAMUSCULAR | Status: DC | PRN
  Administered 2013-06-15: 50 ug via INTRAVENOUS

## 2013-06-15 MED ORDER — MIDAZOLAM HCL 2 MG/2ML IJ SOLN
INTRAMUSCULAR | Status: DC | PRN
  Administered 2013-06-15: 2 mg via INTRAVENOUS

## 2013-06-15 MED ORDER — MIDAZOLAM HCL 2 MG/2ML IJ SOLN
INTRAMUSCULAR | Status: AC
  Filled 2013-06-15: qty 2

## 2013-06-15 SURGICAL SUPPLY — 18 items
CANISTER SUCT 3000ML (MISCELLANEOUS) ×2 IMPLANT
CATH ROBINSON RED A/P 16FR (CATHETERS) ×3 IMPLANT
CLOTH BEACON ORANGE TIMEOUT ST (SAFETY) ×2 IMPLANT
CONTAINER PREFILL 10% NBF 60ML (FORM) ×4 IMPLANT
DRAPE HYSTEROSCOPY (DRAPE) ×1 IMPLANT
DRESSING TELFA 8X3 (GAUZE/BANDAGES/DRESSINGS) ×2 IMPLANT
ELECT REM PT RETURN 9FT ADLT (ELECTROSURGICAL)
ELECTRODE REM PT RTRN 9FT ADLT (ELECTROSURGICAL) IMPLANT
GLOVE BIOGEL PI IND STRL 8.5 (GLOVE) ×1 IMPLANT
GLOVE BIOGEL PI INDICATOR 8.5 (GLOVE) ×1
GLOVE ECLIPSE 8.0 STRL XLNG CF (GLOVE) ×4 IMPLANT
GLYCINE 1.5% IRRIG UROMATIC (IV SOLUTION) ×1 IMPLANT
GOWN STRL REIN XL XLG (GOWN DISPOSABLE) ×4 IMPLANT
LOOP ANGLED CUTTING 22FR (CUTTING LOOP) ×1 IMPLANT
PACK HYSTEROSCOPY LF (CUSTOM PROCEDURE TRAY) ×2 IMPLANT
PAD OB MATERNITY 4.3X12.25 (PERSONAL CARE ITEMS) ×2 IMPLANT
TOWEL OR 17X24 6PK STRL BLUE (TOWEL DISPOSABLE) ×4 IMPLANT
WATER STERILE IRR 1000ML POUR (IV SOLUTION) ×2 IMPLANT

## 2013-06-15 NOTE — H&P (Signed)
The patient was interviewed and examined today.  The previously documented history and physical examination was reviewed. There are no changes. The operative procedure was reviewed. The risks and benefits were outlined again. The specific risks include, but are not limited to, anesthetic complications, bleeding, infections, and possible damage to the surrounding organs. The patient's questions were answered.  We are ready to proceed as outlined. The likelihood of the patient achieving the goals of this procedure is very likely.   BP 156/104  Pulse 92  Temp(Src) 98.1 F (36.7 C) (Oral)  Resp 20  SpO2 97%  Results for orders placed during the hospital encounter of 06/15/13 (from the past 24 hour(s))  BASIC METABOLIC PANEL     Status: Abnormal   Collection Time    06/15/13  7:49 AM      Result Value Range   Sodium 137  135 - 145 mEq/L   Potassium 3.8  3.5 - 5.1 mEq/L   Chloride 102  96 - 112 mEq/L   CO2 24  19 - 32 mEq/L   Glucose, Bld 172 (*) 70 - 99 mg/dL   BUN 12  6 - 23 mg/dL   Creatinine, Ser 7.82  0.50 - 1.10 mg/dL   Calcium 9.3  8.4 - 95.6 mg/dL   GFR calc non Af Amer 83 (*) >90 mL/min   GFR calc Af Amer >90  >90 mL/min  GLUCOSE, CAPILLARY     Status: Abnormal   Collection Time    06/15/13  8:36 AM      Result Value Range   Glucose-Capillary 165 (*) 70 - 99 mg/dL   Comment 1 Documented in Chart     Comment 2 Notify RN     CBC    Component Value Date/Time   WBC 7.1 06/12/2013 0910   RBC 4.99 06/12/2013 0910   HGB 12.9 06/12/2013 0910   HCT 38.6 06/12/2013 0910   PLT 307 06/12/2013 0910   MCV 77.4* 06/12/2013 0910   MCH 25.9* 06/12/2013 0910   MCHC 33.4 06/12/2013 0910   RDW 13.8 06/12/2013 0910    Leonard Schwartz, M.D.

## 2013-06-15 NOTE — Anesthesia Preprocedure Evaluation (Addendum)
Anesthesia Evaluation  Patient identified by MRN, date of birth, ID band Patient awake    Reviewed: Allergy & Precautions, H&P , NPO status , Patient's Chart, lab work & pertinent test results, reviewed documented beta blocker date and time   History of Anesthesia Complications Negative for: history of anesthetic complications  Airway Mallampati: III TM Distance: >3 FB Neck ROM: full  Mouth opening: Limited Mouth Opening  Dental  (+) Teeth Intact   Pulmonary  allergies breath sounds clear to auscultation  Pulmonary exam normal       Cardiovascular Exercise Tolerance: Good hypertension, On Medications Rhythm:regular Rate:Normal     Neuro/Psych Depression negative neurological ROS     GI/Hepatic negative GI ROS, Neg liver ROS,   Endo/Other  diabetes, Type 2, Oral Hypoglycemic AgentsMorbid obesity  Renal/GU negative Renal ROS  Female GU complaint     Musculoskeletal   Abdominal   Peds  Hematology negative hematology ROS (+)   Anesthesia Other Findings   Reproductive/Obstetrics negative OB ROS                          Anesthesia Physical Anesthesia Plan  ASA: III  Anesthesia Plan: General LMA   Post-op Pain Management:    Induction:   Airway Management Planned:   Additional Equipment:   Intra-op Plan:   Post-operative Plan:   Informed Consent: I have reviewed the patients History and Physical, chart, labs and discussed the procedure including the risks, benefits and alternatives for the proposed anesthesia with the patient or authorized representative who has indicated his/her understanding and acceptance.   Dental Advisory Given  Plan Discussed with: CRNA and Surgeon  Anesthesia Plan Comments:         Anesthesia Quick Evaluation

## 2013-06-15 NOTE — Op Note (Signed)
OPERATIVE NOTE  Cynthia Walsh  DOB:    03-24-1961  MRN:    [id number]  CSN:    [id number]  Date of Surgery:  06/15/2013  Preoperative Diagnosis:  Postmenopausal bleeding Obesity Hypertension Diabetes Anemia Depression  Postoperative Diagnosis:  Same Endometrial polyps  Procedure:  Hysteroscopy with resection of endometrial polyps Dilatation and curettage  Surgeon:  Leonard Schwartz, M.D.  Assistant:  None  Anesthetic:  General  Disposition:  The patient is a 52 y.o.-year-old female who presents with postmenopausal bleeding. She understands the indications for her surgical procedure. She accepts the risk of, but not limited to, anesthetic complications, bleeding, infections, and possible damage to the surrounding organs.  Findings:  On examination under anesthesia the uterus was 6-8 weeks size. No adnexal masses were appreciated. No parametrial disease was appreciated. The uterus sounded to 8 cm. The patient was noted to have 2 endometrial polyps with the largest polyp measuring approximately 2 cm.  Procedure:  The patient was taken to the operating room where a general anesthetic was given. The perineum and vagina were prepped with Betadine. The bladder was drained of urine. The patient was sterilely draped. Examination under anesthesia was performed. A paracervical block was placed using 10 cc of half percent Marcaine with epinephrine. An endocervical curettage was performed. The cervix was gently dilated. The diagnostic hysteroscope was inserted and the cavity was carefully inspected. Pictures were taken. Findings included: Endometrial polyps. The diagnostic hysteroscope was removed. The cervix was dilated further. The operative hysteroscope was inserted. The polyps were resected using a single loop. The cavity was then curetted using a sharp curet. The cavity was felt to be clean at the end of our procedure. Hemostasis was adequate. All instruments were  removed. The examination was repeated and the uterus was noted to be firm. Sponge, and needle counts were correct. The estimated blood loss for the procedure was 20 cc. The estimated fluid deficit loss 125 cc. The patient was awakened from her anesthetic without difficulty. She was returned to the supine position and and transported to the recovery room in stable condition. The endocervical curettings, endometrial resections, and endometrial curettings were sent to pathology.  Followup instructions:  The patient will return to see Dr. Stefano Gaul in 2 weeks. She was given a copy of the postoperative instructions for patients who've undergone hysteroscopy.  Discharge medications:  Motrin 800 mg every 8 hours as needed for mild to moderate pain.  Leonard Schwartz, M.D.

## 2013-06-15 NOTE — Transfer of Care (Signed)
Immediate Anesthesia Transfer of Care Note  Patient: Cynthia Walsh  Procedure(s) Performed: Procedure(s): DILATATION & CURETTAGE/HYSTEROSCOPY WITH RESECTOCOPE (N/A)  Patient Location: PACU  Anesthesia Type:General  Level of Consciousness: awake and alert   Airway & Oxygen Therapy: Patient Spontanous Breathing and Patient connected to nasal cannula oxygen  Post-op Assessment: Report given to PACU RN and Post -op Vital signs reviewed and stable  Post vital signs: Reviewed and stable  Complications: No apparent anesthesia complications

## 2013-06-15 NOTE — Anesthesia Postprocedure Evaluation (Signed)
  Anesthesia Post-op Note  Anesthesia Post Note  Patient: Cynthia Walsh  Procedure(s) Performed: Procedure(s) (LRB): DILATATION & CURETTAGE/HYSTEROSCOPY WITH RESECTOCOPE (N/A)  Anesthesia type: General  Patient location: PACU  Post pain: Pain level controlled  Post assessment: Post-op Vital signs reviewed  Last Vitals:  Filed Vitals:   06/15/13 1130  BP: 123/68  Pulse: 73  Temp: 36.5 C  Resp: 16    Post vital signs: Reviewed  Level of consciousness: sedated  Complications: No apparent anesthesia complications

## 2013-06-18 ENCOUNTER — Encounter (HOSPITAL_COMMUNITY): Payer: Self-pay | Admitting: Obstetrics and Gynecology

## 2014-05-10 ENCOUNTER — Other Ambulatory Visit: Payer: Self-pay

## 2014-05-30 ENCOUNTER — Other Ambulatory Visit (HOSPITAL_COMMUNITY): Payer: Self-pay | Admitting: Obstetrics and Gynecology

## 2014-05-30 DIAGNOSIS — Z1231 Encounter for screening mammogram for malignant neoplasm of breast: Secondary | ICD-10-CM

## 2014-06-24 ENCOUNTER — Telehealth: Payer: Self-pay

## 2014-06-24 NOTE — Telephone Encounter (Signed)
ERROR

## 2014-06-26 ENCOUNTER — Ambulatory Visit (HOSPITAL_COMMUNITY): Payer: 59 | Attending: Obstetrics and Gynecology

## 2015-04-09 ENCOUNTER — Other Ambulatory Visit (HOSPITAL_COMMUNITY): Payer: Self-pay | Admitting: Family Medicine

## 2015-04-09 DIAGNOSIS — E010 Iodine-deficiency related diffuse (endemic) goiter: Secondary | ICD-10-CM

## 2015-04-11 ENCOUNTER — Ambulatory Visit (HOSPITAL_COMMUNITY)
Admission: RE | Admit: 2015-04-11 | Discharge: 2015-04-11 | Disposition: A | Discharge status: Home / Self Care | Payer: 59 | Source: Ambulatory Visit | Attending: Family Medicine | Admitting: Family Medicine

## 2015-04-11 DIAGNOSIS — E010 Iodine-deficiency related diffuse (endemic) goiter: Secondary | ICD-10-CM | POA: Insufficient documentation

## 2015-04-11 DIAGNOSIS — E041 Nontoxic single thyroid nodule: Secondary | ICD-10-CM | POA: Diagnosis not present

## 2015-08-01 MED FILL — TRULICITY 1.5 MG/0.5 ML PEN: 1.5 | 28 days supply | Qty: 2 | Fill #4

## 2015-08-01 MED FILL — BENAZEPRIL HCL 20 MG TABLET: 20 | 90 days supply | Qty: 90 | Fill #1

## 2015-09-04 MED FILL — TRULICITY 1.5 MG/0.5 ML PEN: 1.5 | 28 days supply | Qty: 2 | Fill #5

## 2015-09-04 MED FILL — ESCITALOPRAM 20 MG TABLET: 20 | 60 days supply | Qty: 60 | Fill #3

## 2015-09-10 MED FILL — predniSONE 20 MG TABS: 20 | 7 days supply | Qty: 10 | Fill #1

## 2015-09-10 MED FILL — AMLODIPINE BESYLATE 5 MG TA: 5 | 90 days supply | Qty: 90 | Fill #0

## 2015-09-10 MED FILL — DOXEPIN 10 MG CAPSULE: 10 | 90 days supply | Qty: 270 | Fill #1

## 2015-09-11 MED FILL — hydrOXYzine HCL 25 MG TABS: 25 | 30 days supply | Qty: 240 | Fill #0

## 2015-10-03 MED FILL — METFORMIN HCL ER 500 MG TAB: 500 | 90 days supply | Qty: 180 | Fill #0

## 2015-10-03 MED FILL — TRULICITY 1.5 MG/0.5 ML PEN: 1.5 | 28 days supply | Qty: 2 | Fill #0

## 2015-10-08 MED FILL — ALPRAZolam 0.25 MG TABS: 0.25 | 30 days supply | Qty: 60 | Fill #1

## 2015-10-27 DIAGNOSIS — R002 Palpitations: Secondary | ICD-10-CM | POA: Diagnosis not present

## 2015-10-27 DIAGNOSIS — E785 Hyperlipidemia, unspecified: Secondary | ICD-10-CM | POA: Diagnosis not present

## 2015-10-27 DIAGNOSIS — R9431 Abnormal electrocardiogram [ECG] [EKG]: Secondary | ICD-10-CM | POA: Diagnosis not present

## 2015-10-27 DIAGNOSIS — I10 Essential (primary) hypertension: Secondary | ICD-10-CM | POA: Diagnosis not present

## 2015-10-27 DIAGNOSIS — Z794 Long term (current) use of insulin: Secondary | ICD-10-CM | POA: Diagnosis not present

## 2015-10-27 DIAGNOSIS — Z6841 Body Mass Index (BMI) 40.0 and over, adult: Secondary | ICD-10-CM | POA: Diagnosis not present

## 2015-10-27 DIAGNOSIS — E119 Type 2 diabetes mellitus without complications: Secondary | ICD-10-CM | POA: Diagnosis not present

## 2015-11-03 MED FILL — TRULICITY 1.5 MG/0.5 ML PEN: 1.5 | 28 days supply | Qty: 2 | Fill #1

## 2015-11-04 DIAGNOSIS — E119 Type 2 diabetes mellitus without complications: Secondary | ICD-10-CM | POA: Diagnosis not present

## 2015-11-04 DIAGNOSIS — Z6841 Body Mass Index (BMI) 40.0 and over, adult: Secondary | ICD-10-CM | POA: Diagnosis not present

## 2015-11-04 DIAGNOSIS — E041 Nontoxic single thyroid nodule: Secondary | ICD-10-CM | POA: Diagnosis not present

## 2015-11-04 DIAGNOSIS — Z794 Long term (current) use of insulin: Secondary | ICD-10-CM | POA: Diagnosis not present

## 2015-11-10 MED FILL — predniSONE 20 MG TABS: 20 | 7 days supply | Qty: 10 | Fill #0

## 2015-11-11 ENCOUNTER — Telehealth: Payer: Self-pay | Admitting: Cardiovascular Disease

## 2015-11-13 NOTE — Telephone Encounter (Signed)
Close encounter 

## 2015-11-14 ENCOUNTER — Ambulatory Visit: Payer: 59 | Admitting: Cardiovascular Disease

## 2015-11-18 MED FILL — BENAZEPRIL HCL 40 MG TABLET: 40 | 90 days supply | Qty: 90 | Fill #0

## 2015-11-25 MED FILL — CONTOUR NEXT STRIPS: 30 days supply | Qty: 100 | Fill #0

## 2015-11-26 MED FILL — MICROLET LANCETS: 30 days supply | Qty: 100 | Fill #0

## 2015-12-02 ENCOUNTER — Ambulatory Visit: Payer: Self-pay | Admitting: Allergy and Immunology

## 2015-12-04 MED FILL — TRULICITY 1.5 MG/0.5 ML PEN: 1.5 | 28 days supply | Qty: 2 | Fill #2

## 2015-12-04 MED FILL — FUROSEMIDE 20 MG TABLET: 20 | 90 days supply | Qty: 90 | Fill #0

## 2015-12-10 ENCOUNTER — Ambulatory Visit: Payer: 59 | Admitting: Allergy and Immunology

## 2015-12-24 ENCOUNTER — Encounter: Payer: Self-pay | Admitting: Allergy and Immunology

## 2015-12-24 ENCOUNTER — Ambulatory Visit (INDEPENDENT_AMBULATORY_CARE_PROVIDER_SITE_OTHER): Payer: 59 | Admitting: Allergy and Immunology

## 2015-12-24 VITALS — BP 140/88 | HR 84 | Temp 98.3°F | Resp 20 | Ht 62.0 in | Wt 255.2 lb

## 2015-12-24 DIAGNOSIS — T7800XA Anaphylactic reaction due to unspecified food, initial encounter: Secondary | ICD-10-CM

## 2015-12-24 DIAGNOSIS — L309 Dermatitis, unspecified: Secondary | ICD-10-CM

## 2015-12-24 DIAGNOSIS — L500 Allergic urticaria: Secondary | ICD-10-CM

## 2015-12-24 DIAGNOSIS — K2970 Gastritis, unspecified, without bleeding: Secondary | ICD-10-CM | POA: Diagnosis not present

## 2015-12-24 DIAGNOSIS — J3089 Other allergic rhinitis: Secondary | ICD-10-CM

## 2015-12-24 MED ORDER — FLUTICASONE PROPIONATE 50 MCG/ACT NA SUSP: 2.0000 | Freq: Every day | NASAL | Status: DC

## 2015-12-24 MED ORDER — LEVOCETIRIZINE DIHYDROCHLORIDE 5 MG PO TABS: 5.0000 mg | ORAL_TABLET | Freq: Every evening | ORAL | Status: DC

## 2015-12-24 MED ORDER — EPINEPHRINE 0.3 MG/0.3ML IJ SOAJ: 0.3000 mg | Freq: Once | INTRAMUSCULAR | Status: AC

## 2015-12-24 MED ORDER — RANITIDINE HCL 150 MG PO TABS: 150.0000 mg | ORAL_TABLET | Freq: Two times a day (BID) | ORAL | Status: DC

## 2015-12-24 MED ORDER — MOMETASONE FUROATE 0.1 % EX CREA: 1.0000 "application " | TOPICAL_CREAM | Freq: Every day | CUTANEOUS | Status: DC

## 2015-12-24 MED FILL — ALPRAZolam 0.25 MG TABS: 0.25 | 30 days supply | Qty: 60 | Fill #0

## 2015-12-24 MED FILL — ESCITALOPRAM 20 MG TABLET: 20 | 30 days supply | Qty: 30 | Fill #0

## 2015-12-24 NOTE — Assessment & Plan Note (Addendum)
Unclear etiology. Skin tests were positive to peanut, tree nuts, shellfish, fish, soy, peas, and sesame seed.  She strictly avoids nuts and seafood and does not believe that these and sesame seed correlate with her symptoms. NSAIDs and emotional stress commonly exacerbate urticaria but are not the underlying etiology in this case. Physical urticarias are negative by history (i.e. pressure-induced, temperature, vibration, solar, etc.). History and lesions are not consistent with urticaria pigmentosa so I am not suspicious for mastocytosis. There are no concomitant symptoms concerning for anaphylaxis or constitutional symptoms worrisome for an underlying malignancy. We will rule out other potential etiologies with labs. For symptom relief, patient is to take oral antihistamines as directed.  The following labs have been ordered: FCeRI antibody, TSH, anti-thyroglobulin antibody, thyroid peroxidase antibody, tryptase, urea breath test, CBC, CMP, ESR, ANA, and galactose-alpha-1,3-galactose IgE level.  The patient will be called with further recommendations after lab results have returned.  Instructions have been discussed and provided for H1/H2 receptor blockade with titration to find lowest effective dose.  A journal is to be kept recording any foods eaten, beverages consumed, medications taken within a 6 hour period prior to the onset of symptoms, as well as record activities being performed, and environmental conditions. For any symptoms concerning for anaphylaxis, 911 is to be called immediately.

## 2015-12-24 NOTE — Assessment & Plan Note (Signed)
Unclear etiology.  The dermatitis appears eczematous, mainly in sun exposed areas.  Labs will be ordered, including ANA and ESR (as above).  A prescription has been provided for mometasone 0.1% ointment sparingly to affected areas daily as needed.  I have also recommended Aquaphor or Vaseline jelly to affected areas as needed.

## 2015-12-24 NOTE — Assessment & Plan Note (Addendum)
The patient's history suggests food allergy and positive skin test results today confirm this diagnosis.  Meticulous avoidance of peanuts, tree nuts, soybean, peas, sesame seed, shellfish, and fish as discussed.  A prescription has been provided for epinephrine auto-injector 2 pack along with instructions for proper administration.  A food allergy action plan has been provided and discussed.  Medic Alert identification is recommended.

## 2015-12-24 NOTE — Patient Instructions (Addendum)
Chronic urticaria Unclear etiology. Skin tests were positive to peanut, tree nuts, shellfish, fish, soy, peas, and sesame seed.  She strictly avoids nuts and seafood and does not believe that these and sesame seed correlate with her symptoms. NSAIDs and emotional stress commonly exacerbate urticaria but are not the underlying etiology in this case. Physical urticarias are negative by history (i.e. pressure-induced, temperature, vibration, solar, etc.). History and lesions are not consistent with urticaria pigmentosa so I am not suspicious for mastocytosis. There are no concomitant symptoms concerning for anaphylaxis or constitutional symptoms worrisome for an underlying malignancy. We will rule out other potential etiologies with labs. For symptom relief, patient is to take oral antihistamines as directed.  The following labs have been ordered: FCeRI antibody, TSH, anti-thyroglobulin antibody, thyroid peroxidase antibody, tryptase, urea breath test, CBC, CMP, ESR, ANA, and galactose-alpha-1,3-galactose IgE level.  The patient will be called with further recommendations after lab results have returned.  Instructions have been discussed and provided for H1/H2 receptor blockade with titration to find lowest effective dose.  A journal is to be kept recording any foods eaten, beverages consumed, medications taken within a 6 hour period prior to the onset of symptoms, as well as record activities being performed, and environmental conditions. For any symptoms concerning for anaphylaxis, 911 is to be called immediately.  Dermatitis Unclear etiology.  The dermatitis appears eczematous, mainly in sun exposed areas.  Labs will be ordered, including ANA and ESR (as above).  A prescription has been provided for mometasone 0.1% ointment sparingly to affected areas daily as needed.  I have also recommended Aquaphor or Vaseline jelly to affected areas as needed.  Food allergy The patient's history suggests  food allergy and positive skin test results today confirm this diagnosis.  Meticulous avoidance of peanuts, tree nuts, soybean, peas, sesame seed, shellfish, and fish as discussed.  A prescription has been provided for epinephrine auto-injector 2 pack along with instructions for proper administration.  A food allergy action plan has been provided and discussed.  Medic Alert identification is recommended.  Perennial and seasonal allergic rhinitis  Aeroallergen avoidance measures have been discussed and provided in written form.  A prescription has been provided for levocetirizine, 5 mg daily as needed.  A prescription has been provided for fluticasone nasal spray, 2 sprays per nostril daily as needed. Proper nasal spray technique has been discussed and demonstrated.  I have also recommended nasal saline spray (i.e., Simply Saline) or nasal saline lavage (i.e., NeilMed) as needed prior to medicated nasal sprays.    Return in about 4 weeks (around 01/21/2016), or if symptoms worsen or fail to improve.  Urticaria (Hives)  . Levocetirizine (Xyzal) 5 mg twice a day and ranitidine (Zantac) 150 mg twice a day. If no symptoms for 7-14 days then decrease to. . Levocetirizine (Xyzal) 5 mg twice a day and ranitidine (Zantac) 150 mg once a day.  If no symptoms for 7-14 days then decrease to. . Levocetirizine (Xyzal) 5 mg twice a day.  If no symptoms for 7-14 days then decrease to. . Levocetirizine (Xyzal) 5 mg once a day.  May use Benadryl (diphenhydramine) as needed for breakthrough symptoms       If symptoms return, then step up dosage Reducing Pollen Exposure  The American Academy of Allergy, Asthma and Immunology suggests the following steps to reduce your exposure to pollen during allergy seasons.    1. Do not hang sheets or clothing out to dry; pollen may collect on these items. 2. Do  not mow lawns or spend time around freshly cut grass; mowing stirs up pollen. 3. Keep windows closed  at night.  Keep car windows closed while driving. 4. Minimize morning activities outdoors, a time when pollen counts are usually at their highest. 5. Stay indoors as much as possible when pollen counts or humidity is high and on windy days when pollen tends to remain in the air longer. 6. Use air conditioning when possible.  Many air conditioners have filters that trap the pollen spores. 7. Use a HEPA room air filter to remove pollen form the indoor air you breathe.   Control of House Dust Mite Allergen  House dust mites play a major role in allergic asthma and rhinitis.  They occur in environments with high humidity wherever human skin, the food for dust mites is found. High levels have been detected in dust obtained from mattresses, pillows, carpets, upholstered furniture, bed covers, clothes and soft toys.  The principal allergen of the house dust mite is found in its feces.  A gram of dust may contain 1,000 mites and 250,000 fecal particles.  Mite antigen is easily measured in the air during house cleaning activities.    1. Encase mattresses, including the box spring, and pillow, in an air tight cover.  Seal the zipper end of the encased mattresses with wide adhesive tape. 2. Wash the bedding in water of 130 degrees Farenheit weekly.  Avoid cotton comforters/quilts and flannel bedding: the most ideal bed covering is the dacron comforter. 3. Remove all upholstered furniture from the bedroom. 4. Remove carpets, carpet padding, rugs, and non-washable window drapes from the bedroom.  Wash drapes weekly or use plastic window coverings. 5. Remove all non-washable stuffed toys from the bedroom.  Wash stuffed toys weekly. 6. Have the room cleaned frequently with a vacuum cleaner and a damp dust-mop.  The patient should not be in a room which is being cleaned and should wait 1 hour after cleaning before going into the room. 7. Close and seal all heating outlets in the bedroom.  Otherwise, the room will  become filled with dust-laden air.  An electric heater can be used to heat the room. Reduce indoor humidity to less than 50%.  Do not use a humidifier.  Control of Dog or Cat Allergen  Avoidance is the best way to manage a dog or cat allergy. If you have a dog or cat and are allergic to dog or cats, consider removing the dog or cat from the home. If you have a dog or cat but don't want to find it a new home, or if your family wants a pet even though someone in the household is allergic, here are some strategies that may help keep symptoms at bay:  1. Keep the pet out of your bedroom and restrict it to only a few rooms. Be advised that keeping the dog or cat in only one room will not limit the allergens to that room. 2. Don't pet, hug or kiss the dog or cat; if you do, wash your hands with soap and water. 3. High-efficiency particulate air (HEPA) cleaners run continuously in a bedroom or living room can reduce allergen levels over time. 4. Regular use of a high-efficiency vacuum cleaner or a central vacuum can reduce allergen levels. 5. Giving your dog or cat a bath at least once a week can reduce airborne allergen.  Control of Mold Allergen  Mold and fungi can grow on a variety of surfaces provided certain  temperature and moisture conditions exist.  Outdoor molds grow on plants, decaying vegetation and soil.  The major outdoor mold, Alternaria and Cladosporium, are found in very high numbers during hot and dry conditions.  Generally, a late Summer - Fall peak is seen for common outdoor fungal spores.  Rain will temporarily lower outdoor mold spore count, but counts rise rapidly when the rainy period ends.  The most important indoor molds are Aspergillus and Penicillium.  Dark, humid and poorly ventilated basements are ideal sites for mold growth.  The next most common sites of mold growth are the bathroom and the kitchen.  Outdoor [employer] 1. Use air conditioning and keep windows  closed 2. Avoid exposure to decaying vegetation. 3. Avoid leaf raking. 4. Avoid grain handling. 5. Consider wearing a face mask if working in moldy areas.  Indoor Mold Control 1. Maintain humidity below 50%. 2. Clean washable surfaces with 5% bleach solution. 3. Remove sources e.g. Contaminated carpets.

## 2015-12-24 NOTE — Assessment & Plan Note (Signed)
   Aeroallergen avoidance measures have been discussed and provided in written form.  A prescription has been provided for levocetirizine, 5 mg daily as needed.  A prescription has been provided for fluticasone nasal spray, 2 sprays per nostril daily as needed. Proper nasal spray technique has been discussed and demonstrated.  I have also recommended nasal saline spray (i.e., Simply Saline) or nasal saline lavage (i.e., NeilMed) as needed prior to medicated nasal sprays.

## 2015-12-24 NOTE — Progress Notes (Signed)
New Patient Note  RE: Cynthia Walsh MRN: [id number] DOB: February 11, 1961 Date of Office Visit: 12/24/2015  Referring provider: Antony Blackbird, MD Primary care provider: Antony Blackbird, MD  Chief Complaint: Allergic Reaction; Urticaria; Eczema; and Allergic Rhinitis    History of present illness: HPI Comments: [name] is a 55 y.o. female presenting today for consultation of dermatitis.  She reports that approximately one year ago she began to develop recurrent hives.  The hives are described as red, raised, and pruritic.  She had a biopsy revealing findings most consistent with urticaria.  She denies concomitant cardiopulmonary or GI symptoms. More recently, she has developed a dry, erythematous, pruritic rash on her chest, back for neck, and her forearms.  She has attempted to treat these symptoms with over-the-counter diphenhydramine cream, over-the-counter hydrocortisone cream and ointment, cetirizine, and doxepin.  In addition, she has been "on and off" prednisone over the past 6 months.  No specific medication, food, or environmental triggers have been identified.  She experiences generalized pruritus, rash, hoarseness, and the sensation of throat swelling with peanuts, tree nuts, shellfish, and fish. She experiences GI upset with the consumption of soybeans and foods containing soy. Therefore she has eliminated these foods from her diet.  She denies having consumed any these foods prior to or during onset of urticaria or dermatitis.  Olin Hauser experiences nasal congestion, rhinorrhea, sneezing, nasal pruritus, and ocular pruritus.  These symptoms occur year around but are more severe in the springtime.   Assessment and plan: Chronic urticaria Unclear etiology. Skin tests were positive to peanut, tree nuts, shellfish, fish, soy, peas, and sesame seed.  She strictly avoids nuts and seafood and does not believe that these and sesame seed correlate with her symptoms. NSAIDs and emotional  stress commonly exacerbate urticaria but are not the underlying etiology in this case. Physical urticarias are negative by history (i.e. pressure-induced, temperature, vibration, solar, etc.). History and lesions are not consistent with urticaria pigmentosa so I am not suspicious for mastocytosis. There are no concomitant symptoms concerning for anaphylaxis or constitutional symptoms worrisome for an underlying malignancy. We will rule out other potential etiologies with labs. For symptom relief, patient is to take oral antihistamines as directed.  The following labs have been ordered: FCeRI antibody, TSH, anti-thyroglobulin antibody, thyroid peroxidase antibody, tryptase, urea breath test, CBC, CMP, ESR, ANA, and galactose-alpha-1,3-galactose IgE level.  The patient will be called with further recommendations after lab results have returned.  Instructions have been discussed and provided for H1/H2 receptor blockade with titration to find lowest effective dose.  A journal is to be kept recording any foods eaten, beverages consumed, medications taken within a 6 hour period prior to the onset of symptoms, as well as record activities being performed, and environmental conditions. For any symptoms concerning for anaphylaxis, 911 is to be called immediately.  Dermatitis Unclear etiology.  The dermatitis appears eczematous, mainly in sun exposed areas.  Labs will be ordered, including ANA and ESR (as above).  A prescription has been provided for mometasone 0.1% ointment sparingly to affected areas daily as needed.  I have also recommended Aquaphor or Vaseline jelly to affected areas as needed.  Food allergy The patient's history suggests food allergy and positive skin test results today confirm this diagnosis.  Meticulous avoidance of peanuts, tree nuts, soybean, peas, sesame seed, shellfish, and fish as discussed.  A prescription has been provided for epinephrine auto-injector 2 pack along with  instructions for proper administration.  A food allergy action  plan has been provided and discussed.  Medic Alert identification is recommended.  Perennial and seasonal allergic rhinitis  Aeroallergen avoidance measures have been discussed and provided in written form.  A prescription has been provided for levocetirizine, 5 mg daily as needed.  A prescription has been provided for fluticasone nasal spray, 2 sprays per nostril daily as needed. Proper nasal spray technique has been discussed and demonstrated.  I have also recommended nasal saline spray (i.e., Simply Saline) or nasal saline lavage (i.e., NeilMed) as needed prior to medicated nasal sprays.    Meds ordered this encounter  Medications  . levocetirizine (XYZAL) 5 MG tablet    Sig: Take 1 tablet (5 mg total) by mouth every evening.    Dispense:  30 tablet    Refill:  5  . ranitidine (ZANTAC) 150 MG tablet    Sig: Take 1 tablet (150 mg total) by mouth 2 (two) times daily.    Dispense:  60 tablet    Refill:  5  . mometasone (ELOCON) 0.1 % cream    Sig: Apply 1 application topically daily. To affected areas as needed.    Dispense:  45 g    Refill:  5  . fluticasone (FLONASE) 50 MCG/ACT nasal spray    Sig: Place 2 sprays into both nostrils daily.    Dispense:  1 g    Refill:  5  . EPINEPHrine (EPIPEN 2-PAK) 0.3 mg/0.3 mL IJ SOAJ injection    Sig: Inject 0.3 mLs (0.3 mg total) into the muscle once.    Dispense:  4 Device    Refill:  2    Diagnositics: Environmental skin testing: Positive to grass pollens, ragweed pollen, tree pollens, molds, cat hair, and dust mite antigen. Food allergen skin testing: Positive to peanut, almond, hazelnut, [place] nut, lobster, salmon, soybean, green pea, and sesame seed.    Physical examination: Blood pressure 140/88, pulse 84, temperature 98.3 F (36.8 C), temperature source Oral, resp. rate 20, height '5\' 2"'  (1.575 m), weight 255 lb 3.2 oz (115.758 kg).  General: Alert,  interactive, in no acute distress. HEENT: TMs pearly gray, turbinates edematous without discharge, post-pharynx moderately erythematous. Neck: Supple without lymphadenopathy. Lungs: Clear to auscultation without wheezing, rhonchi or rales. CV: Normal S1, S2 without murmurs. Abdomen: Nondistended, nontender. Skin: Warm and dry, without lesions or rashes. Extremities:  No clubbing, cyanosis or edema. Neuro:   Grossly intact.  Review of systems:  Review of Systems  Constitutional: Negative for fever, chills and weight loss.  HENT: Positive for congestion. Negative for nosebleeds.   Eyes: Negative for blurred vision.  Respiratory: Negative for hemoptysis, shortness of breath and wheezing.   Cardiovascular: Negative for chest pain.  Gastrointestinal: Negative for diarrhea and constipation.  Genitourinary: Negative for dysuria.  Musculoskeletal: Positive for joint pain. Negative for myalgias.  Skin: Positive for itching and rash.  Neurological: Negative for dizziness.  Endo/Heme/Allergies: Positive for environmental allergies. Does not bruise/bleed easily.    Past medical history:  Past Medical History  Diagnosis Date  . Depression   . Fibroid   . Hypertension   . Irregular bleeding   . UTI (urinary tract infection)     history of UTI   . Full body hives     hx 2012  . Diabetes mellitus without complication ([place])     pre diabetic    Past surgical history:  Past Surgical History  Procedure Laterality Date  . Tubal ligation    . Dilatation & currettage/hysteroscopy with resectocope N/A  06/15/2013    Procedure: [place];  Surgeon: Ena Dawley, MD;  Location: [facility];  Service: Gynecology;  Laterality: N/A;  . Tonsillectomy      Family history: Family History  Problem Relation Age of Onset  . Alzheimer's disease Mother   . Alzheimer's disease Father     father passed away in  Aug 08, 2008    Social history: Social History    Social History  . Marital Status: Married    Spouse Name: N/A  . Number of Children: N/A  . Years of Education: N/A   Occupational History  . Not on file.   Social History Main Topics  . Smoking status: Never Smoker   . Smokeless tobacco: Never Used  . Alcohol Use: No  . Drug Use: No  . Sexual Activity: Yes    Birth Control/ Protection: Surgical     Comment: BTL   Other Topics Concern  . Not on file   Social History Narrative   Environmental History: The patient lives in a 57 year old house with carpeting in the bedroom and central air/heat.  There is a dog in house which does not have access to her bedroom.  She is a nonsmoker.    Medication List       This list is accurate as of: 12/24/15  2:23 PM.  Always use your most recent med list.               atorvastatin 40 MG tablet  Commonly known as:  LIPITOR  Take 40 mg by mouth daily.     benazepril 20 MG tablet  Commonly known as:  LOTENSIN  Take 20 mg by mouth daily.     calcium citrate-vitamin D 200-200 MG-UNIT Tabs  Take 1 tablet by mouth daily.     cetirizine 10 MG tablet  Commonly known as:  ZYRTEC  Take 20 mg by mouth. 3-4 times daily.     diphenhydrAMINE 2 % cream  Commonly known as:  BENADRYL  Apply topically 3 (three) times daily as needed for itching.     doxepin 10 MG capsule  Commonly known as:  SINEQUAN  Take 30 mg by mouth at bedtime as needed (allergies).     EPINEPHrine 0.3 mg/0.3 mL Soaj injection  Commonly known as:  EPIPEN 2-PAK  Inject 0.3 mLs (0.3 mg total) into the muscle once.     escitalopram 20 MG tablet  Commonly known as:  LEXAPRO  Take 20 mg by mouth daily.     fluticasone 50 MCG/ACT nasal spray  Commonly known as:  FLONASE  Place 2 sprays into both nostrils daily.     furosemide 20 MG tablet  Commonly known as:  LASIX  Take 20 mg by mouth as needed.     hydrocortisone cream 1 %  Apply 1 application topically as needed for itching.     hydrOXYzine 25 MG tablet   Commonly known as:  ATARAX/VISTARIL  Take 25-50 mg by mouth 3 (three) times daily as needed for itching.     ibuprofen 800 MG tablet  Commonly known as:  ADVIL,MOTRIN  Take 1 tablet (800 mg total) by mouth every 8 (eight) hours as needed for pain.     levocetirizine 5 MG tablet  Commonly known as:  XYZAL  Take 1 tablet (5 mg total) by mouth every evening.     LORazepam 0.5 MG tablet  Commonly known as:  ATIVAN  Take 0.5 mg by mouth at bedtime.  metFORMIN 500 MG 24 hr tablet  Commonly known as:  GLUCOPHAGE-XR  Take 500 mg by mouth at bedtime.     mometasone 0.1 % cream  Commonly known as:  ELOCON  Apply 1 application topically daily. To affected areas as needed.     multivitamin with minerals Tabs tablet  Take 1 tablet by mouth daily.     ranitidine 150 MG tablet  Commonly known as:  ZANTAC  Take 1 tablet (150 mg total) by mouth 2 (two) times daily.     TRULICITY 1.5 BK/4.7JG Sopn  Generic drug:  Dulaglutide  once a week.        Known medication allergies: Allergies  Allergen Reactions  . Ceclor [Cefaclor] Nausea And Vomiting  . Sulfa Drugs Cross Reactors Itching, Swelling and Rash  . Minocycline   . Peanut-Containing Drug Products   . Shellfish Allergy   . Fruit & Vegetable Daily [Nutritional Supplements] Rash    I appreciate the opportunity to take part in this Olivya's care. Please do not hesitate to contact me with questions.  Sincerely,   R. Edgar Frisk, MD

## 2015-12-29 ENCOUNTER — Ambulatory Visit (INDEPENDENT_AMBULATORY_CARE_PROVIDER_SITE_OTHER): Payer: 59 | Admitting: Cardiovascular Disease

## 2015-12-29 ENCOUNTER — Encounter: Payer: Self-pay | Admitting: Cardiovascular Disease

## 2015-12-29 VITALS — BP 148/92 | HR 75 | Ht 62.0 in | Wt 256.8 lb

## 2015-12-29 DIAGNOSIS — E669 Obesity, unspecified: Secondary | ICD-10-CM

## 2015-12-29 DIAGNOSIS — R9431 Abnormal electrocardiogram [ECG] [EKG]: Secondary | ICD-10-CM | POA: Diagnosis not present

## 2015-12-29 DIAGNOSIS — R600 Localized edema: Secondary | ICD-10-CM

## 2015-12-29 DIAGNOSIS — R002 Palpitations: Secondary | ICD-10-CM

## 2015-12-29 DIAGNOSIS — E1169 Type 2 diabetes mellitus with other specified complication: Secondary | ICD-10-CM

## 2015-12-29 DIAGNOSIS — E119 Type 2 diabetes mellitus without complications: Secondary | ICD-10-CM

## 2015-12-29 DIAGNOSIS — I519 Heart disease, unspecified: Secondary | ICD-10-CM

## 2015-12-29 DIAGNOSIS — I10 Essential (primary) hypertension: Secondary | ICD-10-CM | POA: Diagnosis not present

## 2015-12-29 DIAGNOSIS — E785 Hyperlipidemia, unspecified: Secondary | ICD-10-CM

## 2015-12-29 MED ORDER — HYDROCHLOROTHIAZIDE 12.5 MG PO CAPS: 12.5000 mg | ORAL_CAPSULE | Freq: Every day | ORAL | Status: DC

## 2015-12-29 MED FILL — HYDROCHLOROTHIAZIDE 12.5 MG: 12.5 | 90 days supply | Qty: 90 | Fill #0

## 2015-12-29 NOTE — Progress Notes (Signed)
Patient ID: Cynthia Walsh, female   DOB: 1961/02/22, 55 y.o.   MRN: [id number]    Cardiology Office Note    Date:  12/30/2015   ID:  Cynthia Walsh, DOB July 14, 1961, MRN [id number]  PCP:  Antony Blackbird, MD  Cardiologist:  Sanda Klein, MD   Chief Complaint  Patient presents with  . New Evaluation    Referred by Antony Blackbird, MD for Palpitations  pt c/o allergic reation to amlodipine (palpitations and swelling in both feet)--was switched back to Benazepril 20mg  and has not felt anymore palpitations or had any other Sx.--has not taken BP meds today    History of Present Illness:  Cynthia Walsh is a 55 y.o. female with morbid obesity, hyperlipidemia, hypertension and type 2 diabetes mellitus, referred for complaints of palpitations and an abnormal electrocardiogram.  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  She was recently switched from benazepril to amlodipine for hypertension since there was concern that the ACE inhibitor might be contributing to a pruritic rash that she has been experiencing. When she started taking amlodipine she developed a rapid heartbeat and ankle swelling. She switch back to benazepril and the symptoms resolved. Her skin rash is also improving under the care of an Allergologist.  She denies any problems with exertional dyspnea, exertional angina, syncope, dizziness, lightheadedness, bleeding, persistent leg edema. Her mother had bypass surgery in her mid 35s and died with dementia at age 37. There is no family history of premature CAD.  She has had an abnormal electrocardiogram for years. The ECG today looks very similar to one from 2013 shown ST segment depression and T-wave inversion in leads 3, aVF, V3-V6 in a pattern that is somewhat suggestive of left ventricular hypertrophy. The voltage does not meet criteria for LVH. In 2013 she underwent a nuclear stress test and an echocardiogram as part of a  preoperative evaluation. The nuclear stress test was normal. Echocardiogram showed normal left ventricular systolic function but was described as showing grade 1 diastolic dysfunction. Interestingly there was no report of left ventricular hypertrophy or left atrial abnormality, but her annular diastolic Doppler velocities were clearly reduced.   Past Medical History  Diagnosis Date  . Depression   . Fibroid   . Hypertension   . Irregular bleeding   . UTI (urinary tract infection)     history of UTI   . Full body hives     hx 2012  . Diabetes mellitus without complication ([place])     pre diabetic    Past Surgical History  Procedure Laterality Date  . Tubal ligation    . Dilatation & currettage/hysteroscopy with resectocope N/A 06/15/2013    Procedure: [place];  Surgeon: Ena Dawley, MD;  Location: [facility];  Service: Gynecology;  Laterality: N/A;  . Tonsillectomy      Current Medications: Outpatient Prescriptions Prior to Visit  Medication Sig Dispense Refill  . atorvastatin (LIPITOR) 40 MG tablet Take 40 mg by mouth daily.    . benazepril (LOTENSIN) 20 MG tablet Take 20 mg by mouth daily.    . calcium citrate-vitamin D 200-200 MG-UNIT TABS Take 1 tablet by mouth daily.    . cetirizine (ZYRTEC) 10 MG tablet Take 20 mg by mouth. 3-4 times daily.    . diphenhydrAMINE (BENADRYL) 2 % cream Apply topically 3 (three) times daily as needed for itching.    Marland Kitchen doxepin (SINEQUAN)  10 MG capsule Take 30 mg by mouth at bedtime as needed (allergies).     . Dulaglutide (TRULICITY) 1.5 0000000 SOPN once a week.    Marland Kitchen EPINEPHrine (EPIPEN 2-PAK) 0.3 mg/0.3 mL IJ SOAJ injection Inject 0.3 mLs (0.3 mg total) into the muscle once. 4 Device 2  . escitalopram (LEXAPRO) 20 MG tablet Take 20 mg by mouth daily.    . fluticasone (FLONASE) 50 MCG/ACT nasal spray Place 2 sprays into both nostrils daily. 1 g 5  . hydrocortisone cream 1 % Apply 1 application  topically as needed for itching.    . hydrOXYzine (ATARAX/VISTARIL) 25 MG tablet Take 25-50 mg by mouth 3 (three) times daily as needed for itching.     Marland Kitchen ibuprofen (ADVIL,MOTRIN) 800 MG tablet Take 1 tablet (800 mg total) by mouth every 8 (eight) hours as needed for pain. 50 tablet 1  . levocetirizine (XYZAL) 5 MG tablet Take 1 tablet (5 mg total) by mouth every evening. 30 tablet 5  . LORazepam (ATIVAN) 0.5 MG tablet Take 0.5 mg by mouth at bedtime.    . metFORMIN (GLUCOPHAGE-XR) 500 MG 24 hr tablet Take 500 mg by mouth at bedtime.    . mometasone (ELOCON) 0.1 % cream Apply 1 application topically daily. To affected areas as needed. 45 g 5  . Multiple Vitamin (MULTIVITAMIN WITH MINERALS) TABS Take 1 tablet by mouth daily.    . ranitidine (ZANTAC) 150 MG tablet Take 1 tablet (150 mg total) by mouth 2 (two) times daily. 60 tablet 5  . furosemide (LASIX) 20 MG tablet Take 20 mg by mouth as needed.     No facility-administered medications prior to visit.     Allergies:   Ceclor; Sulfa drugs cross reactors; Liraglutide; Minocycline; Peanut-containing drug products; Shellfish allergy; Amlodipine; and Fruit & vegetable daily   Social History   Social History  . Marital Status: Married    Spouse Name: N/A  . Number of Children: N/A  . Years of Education: N/A   Social History Main Topics  . Smoking status: Never Smoker   . Smokeless tobacco: Never Used  . Alcohol Use: No  . Drug Use: No  . Sexual Activity: Yes    Birth Control/ Protection: Surgical     Comment: BTL   Other Topics Concern  . None   Social History Narrative     Family History:  The patient's family history includes Alzheimer's disease in her father and mother. Her mother had bypass at age 41  ROS:   Please see the history of present illness.    ROS All other systems reviewed and are negative.   PHYSICAL EXAM:   VS:  BP 148/92 mmHg  Pulse 75  Ht 5\' 2"  (1.575 m)  Wt 116.484 kg (256 lb 12.8 oz)  BMI 46.96 kg/m2    GEN: Morbidly obese, well developed, in no acute distress HEENT: normal Neck: no JVD, carotid bruits, or masses Cardiac: RRR; no murmurs, rubs, or gallops,no edema  Respiratory:  clear to auscultation bilaterally, normal work of breathing GI: soft, nontender, nondistended, + BS MS: no deformity or atrophy Skin: warm and dry, no rash Neuro:  Alert and Oriented x 3, Strength and sensation are intact Psych: euthymic mood, full affect  Wt Readings from Last 3 Encounters:  12/29/15 116.484 kg (256 lb 12.8 oz)  12/24/15 115.758 kg (255 lb 3.2 oz)  06/12/13 117.935 kg (260 lb)      Studies/Labs Reviewed:   EKG:  EKG is ordered  today.  The ekg ordered today demonstrates Normal sinus rhythm, normal pulses, ST segment depression and T-wave inversion in leads 3, F, V3-V6   ASSESSMENT:    1. Abnormal EKG   2. Essential hypertension   3. Left ventricular diastolic dysfunction   4. Diabetes mellitus type 2 in obese ([place])   5. Hyperlipidemia   6. Morbid obesity due to excess calories ([place])   7. Bilateral lower extremity edema   8. Palpitations      PLAN:  In order of problems listed above:  1. Abnormal ECG:I suspect that Cynthia Walsh does have a left ventricular hypertrophy, but the voltage is masked by morbid obesity. Would like to clarify this with a repeat echocardiogram, since her previous study shows conflicting evidence of diastolic dysfunction without left ventricular hypertrophy or left atrial enlargement.  2. HTN: her blood pressure is elevated today. I think before switching to amlodipine she had also been taking a diuretic with the ACE inhibitor. Will restart a low dose of hydrochlorothiazide 3. Diast dysfunction: Suspect this is due to LVH, repeat echo. She does not have any overt evidence of diastolic heart failure or hypervolemia today. 4. DM: She reports that she has good glycemic control on the current regimen 5. HLP: eed to get her lipid profile. She is on statin  therapy. 6. Obesity: Most if not all of the above health problems are related to morbid obesity and she would greatly benefit from weight loss. She does not have symptoms of obstructive sleep apnea. She only scores 3 points on the Epworth Sleepiness Scale 7. The edema was likely a side effect of amlodipine, may be potentiated by underlying diastolic dysfunction. It has resolved 8. The palpitations may be due to sinus tachycardia, after starting to take a vasodilator. That resolved. It sounds much less likely that they represented a short arrhythmia, but Cynthia Walsh is at risk for development of atrial fibrillation due to obesity and hypertension. If the palpitations return, consider a rhythm monitor.    Medication Adjustments/Labs and Tests Ordered: Current medicines are reviewed at length with the patient today.  Concerns regarding medicines are outlined above.  Medication changes, Labs and Tests ordered today are listed in the Patient Instructions below. Patient Instructions  Medication Instructions: Dr Sallyanne Kuster has recommended making the following medication changes: 1. STOP Furosemide 2. START Hydrochlorothiazide 12.5 mg - take 1 tablet by mouth daily  Labwork: NONE ORDERED  Testing/Procedures: 1. Echocardiogram - Your physician has requested that you have an echocardiogram. Echocardiography is a painless test that uses sound waves to create images of your heart. It provides your doctor with information about the size and shape of your heart and how well your heart's chambers and valves are working. This procedure takes approximately one hour. There are no restrictions for this procedure. This will be done at our [facility] location - [address], [address].  Follow-up: Dr Sallyanne Kuster recommends that you schedule a follow-up appointment in 1 year. You will receive a reminder letter in the mail two months in advance. If you don't receive a letter, please call our office to schedule the follow-up  appointment.  If you need a refill on your cardiac medications before your next appointment, please call your pharmacy.    Signed, Sanda Klein, MD  12/30/2015 10:16 AM    [facility] [place], [place],   [address] Phone: [phone]; Fax: [phone]

## 2015-12-29 NOTE — Patient Instructions (Signed)
Medication Instructions: Dr Sallyanne Kuster has recommended making the following medication changes: 1. STOP Furosemide 2. START Hydrochlorothiazide 12.5 mg - take 1 tablet by mouth daily  Labwork: NONE ORDERED  Testing/Procedures: 1. Echocardiogram - Your physician has requested that you have an echocardiogram. Echocardiography is a painless test that uses sound waves to create images of your heart. It provides your doctor with information about the size and shape of your heart and how well your heart's chambers and valves are working. This procedure takes approximately one hour. There are no restrictions for this procedure. This will be done at our [facility] location - [address].  Follow-up: Dr Sallyanne Kuster recommends that you schedule a follow-up appointment in 1 year. You will receive a reminder letter in the mail two months in advance. If you don't receive a letter, please call our office to schedule the follow-up appointment.  If you need a refill on your cardiac medications before your next appointment, please call your pharmacy.

## 2015-12-30 DIAGNOSIS — E1169 Type 2 diabetes mellitus with other specified complication: Secondary | ICD-10-CM | POA: Insufficient documentation

## 2015-12-30 DIAGNOSIS — E669 Obesity, unspecified: Secondary | ICD-10-CM

## 2015-12-30 DIAGNOSIS — I10 Essential (primary) hypertension: Secondary | ICD-10-CM | POA: Insufficient documentation

## 2015-12-30 DIAGNOSIS — E785 Hyperlipidemia, unspecified: Secondary | ICD-10-CM

## 2016-01-07 MED FILL — TRULICITY 1.5 MG/0.5 ML PEN: 1.5 | 28 days supply | Qty: 2 | Fill #3

## 2016-01-16 ENCOUNTER — Ambulatory Visit (HOSPITAL_COMMUNITY): Payer: 59 | Attending: Cardiovascular Disease

## 2016-01-16 ENCOUNTER — Other Ambulatory Visit: Payer: Self-pay

## 2016-01-16 DIAGNOSIS — E785 Hyperlipidemia, unspecified: Secondary | ICD-10-CM | POA: Diagnosis not present

## 2016-01-16 DIAGNOSIS — E119 Type 2 diabetes mellitus without complications: Secondary | ICD-10-CM | POA: Diagnosis not present

## 2016-01-16 DIAGNOSIS — R9431 Abnormal electrocardiogram [ECG] [EKG]: Secondary | ICD-10-CM | POA: Diagnosis not present

## 2016-01-16 DIAGNOSIS — R600 Localized edema: Secondary | ICD-10-CM | POA: Insufficient documentation

## 2016-01-16 DIAGNOSIS — I119 Hypertensive heart disease without heart failure: Secondary | ICD-10-CM | POA: Diagnosis not present

## 2016-01-16 DIAGNOSIS — I10 Essential (primary) hypertension: Secondary | ICD-10-CM

## 2016-01-16 LAB — ECHOCARDIOGRAM COMPLETE
CHL CUP DOP CALC LVOT VTI: 24.4 cm
CHL CUP MV DEC (S): 285
E/e' ratio: 9.06
EWDT: 285 ms
FS: 36 % (ref 28–44)
IVS/LV PW RATIO, ED: 1.45
LA ID, A-P, ES: 32 mm
LA vol: 41.2 mL
LADIAMINDEX: 1.37 cm/m2
LAVOLA4C: 35 mL
LAVOLIN: 17.7 mL/m2
LEFT ATRIUM END SYS DIAM: 32 mm
LV PW d: 8.01 mm — AB (ref 0.6–1.1)
LV TDI E'MEDIAL: 5.43
LVEEAVG: 9.06
LVEEMED: 9.06
LVELAT: 7.62 cm/s
LVOT area: 3.14 cm2
LVOT peak vel: 110 cm/s
LVOTD: 20 mm
LVOTSV: 77 mL
Lateral S' vel: 12.9 cm/s
MV pk E vel: 69 m/s
MVPKAVEL: 72.4 m/s
TDI e' lateral: 7.62

## 2016-01-16 MED FILL — MOMETASONE FUROATE 0.1% CRM: 0.1 | 20 days supply | Qty: 45 | Fill #0

## 2016-01-16 MED FILL — raNITIdine HCL 150 MG TABS: 150 | 30 days supply | Qty: 60 | Fill #0

## 2016-01-16 MED FILL — LEVOCETIRIZINE 5 MG TABLET: 5 | 30 days supply | Qty: 30 | Fill #0

## 2016-01-19 ENCOUNTER — Other Ambulatory Visit: Payer: Self-pay | Admitting: Allergy and Immunology

## 2016-01-19 DIAGNOSIS — K2970 Gastritis, unspecified, without bleeding: Secondary | ICD-10-CM | POA: Diagnosis not present

## 2016-01-19 DIAGNOSIS — L500 Allergic urticaria: Secondary | ICD-10-CM | POA: Diagnosis not present

## 2016-01-19 LAB — CBC WITH DIFFERENTIAL/PLATELET
Basophils Absolute: 0 cells/uL (ref 0–200)
Basophils Relative: 0 %
Eosinophils Absolute: 134 cells/uL (ref 15–500)
Eosinophils Relative: 2 %
HCT: 41.2 % (ref 35.0–45.0)
Hemoglobin: 13.6 g/dL (ref 11.7–15.5)
Lymphocytes Relative: 49 %
Lymphs Abs: 3283 cells/uL (ref 850–3900)
MCH: 25.9 pg — ABNORMAL LOW (ref 27.0–33.0)
MCHC: 33 g/dL (ref 32.0–36.0)
MCV: 78.5 fL — ABNORMAL LOW (ref 80.0–100.0)
MPV: 10 fL (ref 7.5–12.5)
Monocytes Absolute: 469 cells/uL (ref 200–950)
Monocytes Relative: 7 %
Neutro Abs: 2814 cells/uL (ref 1500–7800)
Neutrophils Relative %: 42 %
Platelets: 338 10*3/uL (ref 140–400)
RBC: 5.25 MIL/uL — ABNORMAL HIGH (ref 3.80–5.10)
RDW: 14.4 % (ref 11.0–15.0)
WBC: 6.7 10*3/uL (ref 3.8–10.8)

## 2016-01-19 LAB — SEDIMENTATION RATE: Sed Rate: 12 mm/hr (ref 0–30)

## 2016-01-19 LAB — COMPREHENSIVE METABOLIC PANEL
ALT: 18 U/L (ref 6–29)
AST: 16 U/L (ref 10–35)
Albumin: 4.2 g/dL (ref 3.6–5.1)
Alkaline Phosphatase: 97 U/L (ref 33–130)
BUN: 15 mg/dL (ref 7–25)
CO2: 23 mmol/L (ref 20–31)
Calcium: 9.6 mg/dL (ref 8.6–10.4)
Chloride: 102 mmol/L (ref 98–110)
Creat: 0.87 mg/dL (ref 0.50–1.05)
Glucose, Bld: 81 mg/dL (ref 65–99)
Potassium: 4 mmol/L (ref 3.5–5.3)
Sodium: 138 mmol/L (ref 135–146)
Total Bilirubin: 0.3 mg/dL (ref 0.2–1.2)
Total Protein: 7.7 g/dL (ref 6.1–8.1)

## 2016-01-20 LAB — TRYPTASE: Tryptase: 4.4 ug/L (ref <11)

## 2016-01-20 LAB — ANA: Anti Nuclear Antibody(ANA): NEGATIVE

## 2016-01-20 LAB — H. PYLORI BREATH TEST: H. pylori Breath Test: NOT DETECTED

## 2016-01-21 LAB — ALPHA-GAL PANEL
Allergen, Mutton, f88: 0.16 kU/L — ABNORMAL HIGH
Allergen, Pork, f26: 0.17 kU/L — ABNORMAL HIGH
Beef: 0.3 kU/L — ABNORMAL HIGH
Galactose-alpha-1,3-galactose IgE: 0.11 kU/L (ref <0.35)

## 2016-01-27 LAB — CP CHRONIC URTICARIA INDEX PANEL
Histamine Release: <16 % (ref <16)
TSH: 2.42 mIU/L
Thyroglobulin Ab: <1 IU/mL (ref <2)
Thyroperoxidase Ab SerPl-aCnc: 8 IU/mL (ref <9)

## 2016-01-28 ENCOUNTER — Telehealth: Payer: Self-pay | Admitting: *Deleted

## 2016-01-28 NOTE — Telephone Encounter (Signed)
Informed patient of results. Patient states that the H1/H2 receptor blockade is really helping her. Informed patient to keep a journal with any significant reactions and we will discuss her journal at her follow up. She agreed with plan.

## 2016-01-28 NOTE — Telephone Encounter (Signed)
-----   Message from Adelina Mings, MD sent at 01/28/2016 12:59 PM EDT ----- Please inform the patient that no laboratory results point to an underlying etiology.  There were equivocal/borderline positive results to beef, pork, and lamb, however her symptoms do not seem to correlate with the consumption of these foods.  Therefore, these most likely represent false positive results. Continue H1/H2 receptor blockade, titrating to the lowest effective dose necessary to suppress urticaria. Should significant or new symptoms occur, a journal is to be kept recording any foods eaten, beverages consumed, medications taken, activities performed, and environmental conditions within a 6 hour time period prior to the onset of symptoms. For any symptoms concerning for anaphylaxis, epinephrine is to be administered and 911 is to be called immediately. Follow up as directed. Please send lab results to patient's PCP. Thanks.

## 2016-02-06 MED FILL — ESCITALOPRAM 20 MG TABLET: 20 | 30 days supply | Qty: 30 | Fill #1

## 2016-02-06 MED FILL — LEVOCETIRIZINE 5 MG TABLET: 5 | 30 days supply | Qty: 30 | Fill #1

## 2016-02-06 MED FILL — hydrOXYzine HCL 25 MG TABS: 25 | 30 days supply | Qty: 240 | Fill #1

## 2016-02-24 MED FILL — TRULICITY 1.5 MG/0.5 ML PEN: 1.5 | 28 days supply | Qty: 2 | Fill #4

## 2016-02-24 MED FILL — DOXEPIN 10 MG CAPSULE: 10 | 90 days supply | Qty: 270 | Fill #2

## 2016-02-27 MED FILL — BENAZEPRIL HCL 40 MG TABLET: 40 | 90 days supply | Qty: 90 | Fill #1

## 2016-03-01 MED FILL — LEVOCETIRIZINE 5 MG TABLET: 5 | 30 days supply | Qty: 30 | Fill #2

## 2016-03-22 MED FILL — ESCITALOPRAM 20 MG TABLET: 20 | 30 days supply | Qty: 30 | Fill #2

## 2016-03-22 MED FILL — LEVOCETIRIZINE 5 MG TABLET: 5 | 30 days supply | Qty: 30 | Fill #3

## 2016-04-02 MED FILL — TRULICITY 1.5 MG/0.5 ML PEN: 1.5 | 28 days supply | Qty: 2 | Fill #5

## 2016-04-07 MED FILL — METFORMIN HCL ER 500 MG TAB: 500 | 90 days supply | Qty: 180 | Fill #1

## 2016-04-16 MED FILL — LEVOCETIRIZINE 5 MG TABLET: 5 | 30 days supply | Qty: 30 | Fill #4

## 2016-05-05 DIAGNOSIS — Z6841 Body Mass Index (BMI) 40.0 and over, adult: Secondary | ICD-10-CM | POA: Diagnosis not present

## 2016-05-05 DIAGNOSIS — E119 Type 2 diabetes mellitus without complications: Secondary | ICD-10-CM | POA: Diagnosis not present

## 2016-05-05 DIAGNOSIS — Z794 Long term (current) use of insulin: Secondary | ICD-10-CM | POA: Diagnosis not present

## 2016-05-05 DIAGNOSIS — E041 Nontoxic single thyroid nodule: Secondary | ICD-10-CM | POA: Diagnosis not present

## 2016-05-06 MED FILL — MOMETASONE FUROATE 0.1% CRM: 0.1 | 20 days supply | Qty: 45 | Fill #1

## 2016-05-06 MED FILL — TRULICITY 1.5 MG/0.5 ML PEN: 1.5 | 28 days supply | Qty: 2 | Fill #6

## 2016-05-11 MED FILL — ESCITALOPRAM 20 MG TABLET: 20 | 30 days supply | Qty: 30 | Fill #3

## 2016-06-01 MED FILL — raNITIdine HCL 150 MG TABS: 150 | 30 days supply | Qty: 60 | Fill #1

## 2016-06-01 MED FILL — TRULICITY 1.5 MG/0.5 ML PEN: 1.5 | 28 days supply | Qty: 2 | Fill #7

## 2016-06-01 MED FILL — BENAZEPRIL HCL 40 MG TABLET: 40 | 90 days supply | Qty: 90 | Fill #2

## 2016-06-01 MED FILL — hydrOXYzine HCL 25 MG TABS: 25 | 30 days supply | Qty: 240 | Fill #2

## 2016-06-01 MED FILL — ALPRAZolam 0.25 MG TABS: 0.25 | 30 days supply | Qty: 60 | Fill #1

## 2016-06-25 MED FILL — DOXEPIN 10 MG CAPSULE: 10 | 90 days supply | Qty: 270 | Fill #0

## 2016-06-25 MED FILL — ESCITALOPRAM 20 MG TABLET: 20 | 30 days supply | Qty: 30 | Fill #0

## 2016-07-05 DIAGNOSIS — E119 Type 2 diabetes mellitus without complications: Secondary | ICD-10-CM | POA: Diagnosis not present

## 2016-07-05 DIAGNOSIS — H43813 Vitreous degeneration, bilateral: Secondary | ICD-10-CM | POA: Diagnosis not present

## 2016-07-05 DIAGNOSIS — H40013 Open angle with borderline findings, low risk, bilateral: Secondary | ICD-10-CM | POA: Diagnosis not present

## 2016-07-06 DIAGNOSIS — Z6841 Body Mass Index (BMI) 40.0 and over, adult: Secondary | ICD-10-CM | POA: Diagnosis not present

## 2016-07-06 DIAGNOSIS — N899 Noninflammatory disorder of vagina, unspecified: Secondary | ICD-10-CM | POA: Diagnosis not present

## 2016-07-06 DIAGNOSIS — Z124 Encounter for screening for malignant neoplasm of cervix: Secondary | ICD-10-CM | POA: Diagnosis not present

## 2016-07-06 DIAGNOSIS — Z1231 Encounter for screening mammogram for malignant neoplasm of breast: Secondary | ICD-10-CM | POA: Diagnosis not present

## 2016-07-06 DIAGNOSIS — Z01411 Encounter for gynecological examination (general) (routine) with abnormal findings: Secondary | ICD-10-CM | POA: Diagnosis not present

## 2016-07-06 MED FILL — NYSTATIN-TRIAMCINOLONE OINT: 100000-0.1 | 10 days supply | Qty: 30 | Fill #0

## 2016-07-06 MED FILL — NYSTATIN 100,000 UNIT/GM PO: 100000 | 20 days supply | Qty: 30 | Fill #0

## 2016-07-06 MED FILL — TRULICITY 1.5 MG/0.5 ML PEN: 1.5 | 28 days supply | Qty: 2 | Fill #8

## 2016-07-30 DIAGNOSIS — E041 Nontoxic single thyroid nodule: Secondary | ICD-10-CM | POA: Diagnosis not present

## 2016-07-30 DIAGNOSIS — E119 Type 2 diabetes mellitus without complications: Secondary | ICD-10-CM | POA: Diagnosis not present

## 2016-07-30 DIAGNOSIS — I10 Essential (primary) hypertension: Secondary | ICD-10-CM | POA: Diagnosis not present

## 2016-07-30 DIAGNOSIS — F418 Other specified anxiety disorders: Secondary | ICD-10-CM | POA: Diagnosis not present

## 2016-07-30 DIAGNOSIS — E785 Hyperlipidemia, unspecified: Secondary | ICD-10-CM | POA: Diagnosis not present

## 2016-08-05 MED FILL — METFORMIN HCL ER 500 MG TAB: 500 | 90 days supply | Qty: 180 | Fill #2

## 2016-08-06 MED FILL — ESCITALOPRAM 20 MG TABLET: 20 | 30 days supply | Qty: 30 | Fill #0

## 2016-08-09 MED FILL — TRULICITY 1.5 MG/0.5 ML PEN: 1.5 | 28 days supply | Qty: 2 | Fill #9

## 2016-08-16 MED FILL — ROSUVASTATIN CALCIUM 10 MG: 10 | 90 days supply | Qty: 90 | Fill #0

## 2016-08-17 ENCOUNTER — Other Ambulatory Visit: Payer: Self-pay | Admitting: Family

## 2016-08-17 DIAGNOSIS — E041 Nontoxic single thyroid nodule: Secondary | ICD-10-CM

## 2016-08-26 MED FILL — NITROFURANTOIN MONO-MCR 100: 100 | 3 days supply | Qty: 6 | Fill #0

## 2016-08-26 MED FILL — BENAZEPRIL HCL 40 MG TABLET: 40 | 90 days supply | Qty: 90 | Fill #3

## 2016-09-07 MED FILL — TRULICITY 1.5 MG/0.5 ML PEN: 1.5 | 28 days supply | Qty: 2 | Fill #10

## 2016-09-17 MED FILL — ESCITALOPRAM 20 MG TABLET: 20 | 90 days supply | Qty: 90 | Fill #0

## 2016-09-24 DIAGNOSIS — Z6841 Body Mass Index (BMI) 40.0 and over, adult: Secondary | ICD-10-CM | POA: Diagnosis not present

## 2016-09-24 DIAGNOSIS — Z713 Dietary counseling and surveillance: Secondary | ICD-10-CM | POA: Diagnosis not present

## 2016-09-28 MED FILL — MOMETASONE FUROATE 0.1% CRM: 0.1 | 20 days supply | Qty: 45 | Fill #2

## 2016-10-12 MED FILL — DOXEPIN 10 MG CAPSULE: 10 | 60 days supply | Qty: 180 | Fill #1

## 2016-10-15 DIAGNOSIS — R948 Abnormal results of function studies of other organs and systems: Secondary | ICD-10-CM | POA: Diagnosis not present

## 2016-10-15 DIAGNOSIS — Z6841 Body Mass Index (BMI) 40.0 and over, adult: Secondary | ICD-10-CM | POA: Diagnosis not present

## 2016-10-15 DIAGNOSIS — R635 Abnormal weight gain: Secondary | ICD-10-CM | POA: Diagnosis not present

## 2016-10-15 MED FILL — MOMETASONE FUROATE 0.1% CRM: 0.1 | 20 days supply | Qty: 45 | Fill #3

## 2016-10-15 MED FILL — TRULICITY 1.5 MG/0.5 ML PEN: 1.5 | 28 days supply | Qty: 2 | Fill #0

## 2016-10-18 MED FILL — METFORMIN HCL ER 500 MG TAB: 500 | 30 days supply | Qty: 60 | Fill #0

## 2016-11-04 DIAGNOSIS — R635 Abnormal weight gain: Secondary | ICD-10-CM | POA: Diagnosis not present

## 2016-11-04 DIAGNOSIS — E1169 Type 2 diabetes mellitus with other specified complication: Secondary | ICD-10-CM | POA: Diagnosis not present

## 2016-11-04 DIAGNOSIS — I10 Essential (primary) hypertension: Secondary | ICD-10-CM | POA: Diagnosis not present

## 2016-11-04 DIAGNOSIS — E669 Obesity, unspecified: Secondary | ICD-10-CM | POA: Diagnosis not present

## 2016-11-04 DIAGNOSIS — E782 Mixed hyperlipidemia: Secondary | ICD-10-CM | POA: Diagnosis not present

## 2016-11-08 MED FILL — HYDROCHLOROTHIAZIDE 12.5 MG: 12.5 | 90 days supply | Qty: 90 | Fill #1

## 2016-11-08 MED FILL — TRULICITY 1.5 MG/0.5 ML PEN: 1.5 | 28 days supply | Qty: 2 | Fill #1

## 2016-11-10 ENCOUNTER — Other Ambulatory Visit: Payer: Self-pay | Admitting: Internal Medicine

## 2016-11-10 DIAGNOSIS — E041 Nontoxic single thyroid nodule: Secondary | ICD-10-CM

## 2016-11-10 DIAGNOSIS — R229 Localized swelling, mass and lump, unspecified: Secondary | ICD-10-CM

## 2016-11-10 DIAGNOSIS — E119 Type 2 diabetes mellitus without complications: Secondary | ICD-10-CM | POA: Diagnosis not present

## 2016-11-10 DIAGNOSIS — Z6841 Body Mass Index (BMI) 40.0 and over, adult: Secondary | ICD-10-CM | POA: Diagnosis not present

## 2016-11-10 DIAGNOSIS — IMO0002 Reserved for concepts with insufficient information to code with codable children: Secondary | ICD-10-CM

## 2016-11-10 DIAGNOSIS — R2230 Localized swelling, mass and lump, unspecified upper limb: Secondary | ICD-10-CM | POA: Diagnosis not present

## 2016-11-15 MED FILL — hydrOXYzine HCL 25 MG TABS: 25 | 30 days supply | Qty: 240 | Fill #0

## 2016-11-17 ENCOUNTER — Ambulatory Visit
Admission: RE | Admit: 2016-11-17 | Discharge: 2016-11-17 | Disposition: A | Discharge status: Home / Self Care | Payer: 59 | Source: Ambulatory Visit | Attending: Internal Medicine | Admitting: Internal Medicine

## 2016-11-17 DIAGNOSIS — IMO0002 Reserved for concepts with insufficient information to code with codable children: Secondary | ICD-10-CM

## 2016-11-17 DIAGNOSIS — E041 Nontoxic single thyroid nodule: Secondary | ICD-10-CM

## 2016-11-17 DIAGNOSIS — M79601 Pain in right arm: Secondary | ICD-10-CM | POA: Diagnosis not present

## 2016-11-17 DIAGNOSIS — R229 Localized swelling, mass and lump, unspecified: Secondary | ICD-10-CM

## 2016-11-22 ENCOUNTER — Ambulatory Visit (INDEPENDENT_AMBULATORY_CARE_PROVIDER_SITE_OTHER): Payer: 59 | Admitting: Cardiology

## 2016-11-22 ENCOUNTER — Encounter: Payer: Self-pay | Admitting: Cardiology

## 2016-11-22 DIAGNOSIS — E1169 Type 2 diabetes mellitus with other specified complication: Secondary | ICD-10-CM

## 2016-11-22 DIAGNOSIS — R9431 Abnormal electrocardiogram [ECG] [EKG]: Secondary | ICD-10-CM | POA: Diagnosis not present

## 2016-11-22 DIAGNOSIS — I10 Essential (primary) hypertension: Secondary | ICD-10-CM | POA: Diagnosis not present

## 2016-11-22 DIAGNOSIS — E669 Obesity, unspecified: Secondary | ICD-10-CM

## 2016-11-22 NOTE — Progress Notes (Signed)
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  She has a history of morbid obesity, hyperlipidemia, hypertension, type 2 diabetes mellitus, and an abnormal electrocardiogram. She saw Dr Sallyanne Kuster in June 2017. Echo done then showed normal LVF with mild LVH and no diastolic dysfunction. She has had a prior Myoview in 2013 that was normal.   She is in the office today to discuss dietary and medication treatment for obesity. She has considered bariatric surgery but wanted to try diet first. She was told she would need approval of her cardiologist for the medication being considered but she can't remember the name of it. Otherwise she is doing well. She uses an exercise bike and treadmill 45 minutes a day without problems.      Current Outpatient Prescriptions  Medication Sig Dispense Refill  . benazepril (LOTENSIN) 20 MG tablet Take 20 mg by mouth daily.    . calcium citrate-vitamin D 200-200 MG-UNIT TABS Take 1 tablet by mouth daily.    . cetirizine (ZYRTEC) 10 MG tablet Take 20 mg by mouth. 3-4 times daily.    . diphenhydrAMINE (BENADRYL) 2 % cream Apply topically 3 (three) times daily as needed for itching.    Marland Kitchen doxepin (SINEQUAN) 10 MG capsule Take 30 mg by mouth at bedtime as needed (allergies).     . Dulaglutide (TRULICITY) 1.5 KZ/6.0FU SOPN once a week.    Marland Kitchen EPINEPHrine (EPIPEN 2-PAK) 0.3 mg/0.3 mL IJ SOAJ injection Inject 0.3 mLs (0.3 mg total) into the muscle once. 4 Device 2  . escitalopram (LEXAPRO) 20 MG tablet Take 20 mg by mouth daily.    . fluticasone (FLONASE) 50 MCG/ACT nasal spray Place 2 sprays into both nostrils daily. 1 g 5  . hydrochlorothiazide (MICROZIDE) 12.5 MG capsule Take 1 capsule (12.5 mg total) by mouth daily. 90 capsule 3  . hydrocortisone cream 1 % Apply 1  application topically as needed for itching.    . hydrOXYzine (ATARAX/VISTARIL) 25 MG tablet Take 25-50 mg by mouth 3 (three) times daily as needed for itching.     Marland Kitchen ibuprofen (ADVIL,MOTRIN) 800 MG tablet Take 1 tablet (800 mg total) by mouth every 8 (eight) hours as needed for pain. 50 tablet 1  . levocetirizine (XYZAL) 5 MG tablet Take 1 tablet (5 mg total) by mouth every evening. 30 tablet 5  . LORazepam (ATIVAN) 0.5 MG tablet Take 0.5 mg by mouth at bedtime.    . metFORMIN (GLUCOPHAGE-XR) 500 MG 24 hr tablet Take 500 mg by mouth at bedtime.    . mometasone (ELOCON) 0.1 % cream Apply 1 application topically daily. To affected areas as needed. 45 g 5  . Multiple Vitamin (MULTIVITAMIN WITH MINERALS) TABS Take 1 tablet by mouth daily.    . ranitidine (ZANTAC) 150 MG tablet Take 1 tablet (150 mg total) by mouth 2 (two) times daily. 60 tablet 5  . rosuvastatin (CRESTOR) 20 MG tablet Take 20 mg by mouth at bedtime.     No current facility-administered medications for this visit.     Allergies  Allergen Reactions  . Ceclor [Cefaclor] Nausea And Vomiting  . Other Itching, Rash and Swelling    Most fruits   . Sulfa Drugs Cross Reactors Itching, Swelling and Rash  . Banana Flavor Hives  .  Liraglutide Other (See Comments)    Other reaction(s): rash Other reaction(s): Other (See Comments)  . Minocycline Itching    Other reaction(s): unknown   . Peanut-Containing Drug Products   . Shellfish Allergy   . Sulfa Antibiotics Itching  . Amlodipine Palpitations  . Fruit & Vegetable Daily [Nutritional Supplements] Rash    Past Medical History:  Diagnosis Date  . Depression   . Diabetes mellitus without complication ([place])    pre diabetic  . Fibroid   . Full body hives    hx 2012  . Hypertension   . Irregular bleeding   . UTI (urinary tract infection)    history of UTI     Social History   Social History  . Marital status: Married    Spouse name: N/A  . Number of children: N/A  .  Years of education: N/A   Occupational History  . Not on file.   Social History Main Topics  . Smoking status: Never Smoker  . Smokeless tobacco: Never Used  . Alcohol use No  . Drug use: No  . Sexual activity: Yes    Birth control/ protection: Surgical     Comment: BTL   Other Topics Concern  . Not on file   Social History Narrative  . No narrative on file     Family History  Problem Relation Age of Onset  . Alzheimer's disease Mother   . Alzheimer's disease Father     father passed away in  11-Aug-2008     Review of Systems: General: negative for chills, fever, night sweats or weight changes.  Cardiovascular: negative for chest pain, dyspnea on exertion, edema, orthopnea, palpitations, paroxysmal nocturnal dyspnea or shortness of breath Dermatological: negative for rash Respiratory: negative for cough or wheezing Urologic: negative for hematuria Abdominal: negative for nausea, vomiting, diarrhea, bright red blood per rectum, melena, or hematemesis Neurologic: negative for visual changes, syncope, or dizziness All other systems reviewed and are otherwise negative except as noted above.    Height 5\' 2"  (1.575 m), weight 260 lb 6.4 oz (118.1 kg).  General appearance: alert, cooperative, no distress and morbidly obese Neck: no carotid bruit and no JVD Lungs: clear to auscultation bilaterally Heart: regular rate and rhythm Extremities: she has a firm, non tender, grape sized lump on her Rt upper arm. Her PCP is aware and she is to have an MRI Skin: Skin color, texture, turgor normal. No rashes or lesions Neurologic: Grossly normal  EKG NSR- flat TW V3-V6, previous abnormalities seen on her EKGs are much less prominent today.   ASSESSMENT AND PLAN:   Abnormal EKG Q in V2 with TWI 3, AVF, V3-V6 Negative Myoview in 2013  Essential hypertension Mild LVH normal LVF, no DD by echo June 2017  Morbid obesity ([place]) BMI 47  Diabetes mellitus type 2 in obese ([place]) On  Glucopahge  Dyslipidemia associated with type 2 diabetes mellitus ([place]) On Crestor 20 mg, followed by PCP   PLAN  I suggested any diet medication or appetite suppressant with a stimulant may cause her to have palpitations. I asked her to send [place] the name of the specific medication being considered and I would review it with dr Sallyanne Kuster and let her know.   Kerin Ransom PA-C 11/22/2016 2:17 PM

## 2016-11-22 NOTE — Assessment & Plan Note (Signed)
On Glucopahge 

## 2016-11-22 NOTE — Assessment & Plan Note (Signed)
Mild LVH normal LVF, no DD by echo June 2017

## 2016-11-22 NOTE — Patient Instructions (Signed)
Medication Instructions: no changes   Labwork: none   Testing/Procedures: none   Follow-Up: with Dr. Sallyanne Kuster in 1 year.  Let us know of any new medications you're being recommended to take so Dr. Sallyanne Kuster can review these.  If you need a refill on your cardiac medications before your next appointment, please call your pharmacy.

## 2016-11-22 NOTE — Assessment & Plan Note (Signed)
Q in V2 with TWI 3, AVF, V3-V6 Negative Myoview in 2013

## 2016-11-22 NOTE — Assessment & Plan Note (Signed)
On Crestor 20 mg, followed by PCP

## 2016-11-22 NOTE — Assessment & Plan Note (Signed)
BMI 47 

## 2016-11-23 ENCOUNTER — Telehealth: Payer: Self-pay | Admitting: Cardiovascular Disease

## 2016-11-23 NOTE — Telephone Encounter (Signed)
Left message for dr to call

## 2016-11-23 NOTE — Telephone Encounter (Signed)
Need to speak to nurse concerning pt's medication pt is currently taking.

## 2016-11-24 NOTE — Addendum Note (Signed)
Addended by: Leanord Asal T on: 11/24/2016 11:06 AM   Modules accepted: Orders

## 2016-11-25 NOTE — Telephone Encounter (Signed)
Spoke with Dr Cheri Kearns [facility] weight management. She states that pt is going to start their weight management program and they will attempt to do all her weight loss with diet changes and maybe in the future she will try Saxenda she states that this is similar to Victoza and she will not give pt any stimulants. If she has any questions about medications in the future she will call to discuss with you.

## 2016-11-26 MED FILL — BENAZEPRIL HCL 40 MG TABLET: 40 | 90 days supply | Qty: 90 | Fill #0

## 2016-12-02 DIAGNOSIS — R229 Localized swelling, mass and lump, unspecified: Secondary | ICD-10-CM | POA: Diagnosis not present

## 2016-12-02 DIAGNOSIS — R2231 Localized swelling, mass and lump, right upper limb: Secondary | ICD-10-CM | POA: Diagnosis not present

## 2016-12-10 MED FILL — TRULICITY 1.5 MG/0.5 ML PEN: 1.5 | 28 days supply | Qty: 2 | Fill #2

## 2016-12-14 ENCOUNTER — Other Ambulatory Visit: Payer: Self-pay | Admitting: Family

## 2016-12-14 DIAGNOSIS — R229 Localized swelling, mass and lump, unspecified: Secondary | ICD-10-CM

## 2016-12-21 MED FILL — DOXEPIN 10 MG CAPSULE: 10 | 30 days supply | Qty: 90 | Fill #0

## 2016-12-21 MED FILL — METFORMIN HCL ER 500 MG TAB: 500 | 30 days supply | Qty: 60 | Fill #1

## 2016-12-28 ENCOUNTER — Ambulatory Visit
Admission: RE | Admit: 2016-12-28 | Discharge: 2016-12-28 | Disposition: A | Discharge status: Home / Self Care | Payer: 59 | Source: Ambulatory Visit | Attending: Family | Admitting: Family

## 2016-12-28 DIAGNOSIS — R229 Localized swelling, mass and lump, unspecified: Secondary | ICD-10-CM

## 2016-12-29 MED FILL — NYSTOP 100,000 UNITS/GM PWD: 100000 | 20 days supply | Qty: 30 | Fill #1

## 2017-01-04 DIAGNOSIS — F418 Other specified anxiety disorders: Secondary | ICD-10-CM | POA: Diagnosis not present

## 2017-01-04 DIAGNOSIS — E119 Type 2 diabetes mellitus without complications: Secondary | ICD-10-CM | POA: Diagnosis not present

## 2017-01-04 DIAGNOSIS — Z6841 Body Mass Index (BMI) 40.0 and over, adult: Secondary | ICD-10-CM | POA: Diagnosis not present

## 2017-01-04 DIAGNOSIS — Z7984 Long term (current) use of oral hypoglycemic drugs: Secondary | ICD-10-CM | POA: Diagnosis not present

## 2017-01-04 DIAGNOSIS — R229 Localized swelling, mass and lump, unspecified: Secondary | ICD-10-CM | POA: Diagnosis not present

## 2017-01-04 DIAGNOSIS — I10 Essential (primary) hypertension: Secondary | ICD-10-CM | POA: Diagnosis not present

## 2017-01-04 DIAGNOSIS — E041 Nontoxic single thyroid nodule: Secondary | ICD-10-CM | POA: Diagnosis not present

## 2017-01-12 MED FILL — ESCITALOPRAM 20 MG TABLET: 20 | 90 days supply | Qty: 90 | Fill #1

## 2017-01-12 MED FILL — TRULICITY 1.5 MG/0.5 ML PEN: 1.5 | 28 days supply | Qty: 2 | Fill #3

## 2017-01-25 MED FILL — TOPIRAMATE 25 MG TABLET: 25 | 30 days supply | Qty: 30 | Fill #0

## 2017-01-27 ENCOUNTER — Other Ambulatory Visit: Payer: Self-pay | Admitting: Allergy and Immunology

## 2017-01-27 DIAGNOSIS — L309 Dermatitis, unspecified: Secondary | ICD-10-CM

## 2017-01-27 MED FILL — METFORMIN HCL ER 500 MG TAB: 500 | 30 days supply | Qty: 60 | Fill #2

## 2017-01-27 MED FILL — MOMETASONE FUROATE 0.1% CRM: 0.1 | 30 days supply | Qty: 45 | Fill #0

## 2017-01-28 ENCOUNTER — Ambulatory Visit: Payer: Self-pay | Admitting: Surgery

## 2017-01-28 DIAGNOSIS — R2231 Localized swelling, mass and lump, right upper limb: Secondary | ICD-10-CM | POA: Diagnosis not present

## 2017-02-08 MED FILL — DOXEPIN 10 MG CAPSULE: 10 | 30 days supply | Qty: 90 | Fill #0

## 2017-02-22 MED FILL — TRULICITY 1.5 MG/0.5 ML PEN: 1.5 | 28 days supply | Qty: 2 | Fill #4

## 2017-02-28 MED FILL — BENAZEPRIL HCL 40 MG TABLET: 40 | 90 days supply | Qty: 90 | Fill #1

## 2017-03-02 MED FILL — METFORMIN HCL ER 500 MG TAB: 500 | 90 days supply | Qty: 180 | Fill #0

## 2017-03-03 ENCOUNTER — Other Ambulatory Visit: Payer: Self-pay | Admitting: Surgery

## 2017-03-03 DIAGNOSIS — D2111 Benign neoplasm of connective and other soft tissue of right upper limb, including shoulder: Secondary | ICD-10-CM | POA: Diagnosis not present

## 2017-03-03 DIAGNOSIS — D763 Other histiocytosis syndromes: Secondary | ICD-10-CM | POA: Diagnosis not present

## 2017-03-17 MED FILL — DOXEPIN 10 MG CAPSULE: 10 | 30 days supply | Qty: 90 | Fill #1

## 2017-03-17 MED FILL — hydrOXYzine HCL 25 MG TABS: 25 | 30 days supply | Qty: 240 | Fill #1

## 2017-03-17 MED FILL — TRULICITY 1.5 MG/0.5 ML PEN: 1.5 | 28 days supply | Qty: 2 | Fill #5

## 2017-03-29 MED FILL — NYSTATIN-TRIAMCINOLONE OINT: 100000-0.1 | 10 days supply | Qty: 30 | Fill #1

## 2017-03-29 MED FILL — TOPIRAMATE 25 MG TABLET: 25 | 30 days supply | Qty: 30 | Fill #1

## 2017-04-05 ENCOUNTER — Telehealth: Payer: Self-pay | Admitting: Oncology

## 2017-04-05 ENCOUNTER — Encounter: Payer: Self-pay | Admitting: Oncology

## 2017-04-05 NOTE — Telephone Encounter (Signed)
Appt has been scheduled for the pt to see Dr. Benay Spice on 9/18 at 2pm. Pt aware to arrive 30 minutes early. Address and insurance verified. Letter mailed.

## 2017-04-11 MED FILL — DOXEPIN 10 MG CAPSULE: 10 | 30 days supply | Qty: 90 | Fill #0

## 2017-04-11 MED FILL — TRULICITY 1.5 MG/0.5 ML PEN: 1.5 | 28 days supply | Qty: 2 | Fill #6

## 2017-04-11 MED FILL — ESCITALOPRAM 20 MG TABLET: 20 | 90 days supply | Qty: 90 | Fill #0

## 2017-04-12 ENCOUNTER — Ambulatory Visit (HOSPITAL_BASED_OUTPATIENT_CLINIC_OR_DEPARTMENT_OTHER): Payer: 59 | Admitting: Oncology

## 2017-04-12 VITALS — BP 156/99 | HR 85 | Temp 99.2°F | Resp 19 | Ht 62.0 in | Wt 254.9 lb

## 2017-04-12 DIAGNOSIS — I10 Essential (primary) hypertension: Secondary | ICD-10-CM

## 2017-04-12 DIAGNOSIS — D763 Other histiocytosis syndromes: Secondary | ICD-10-CM | POA: Diagnosis not present

## 2017-04-12 DIAGNOSIS — E119 Type 2 diabetes mellitus without complications: Secondary | ICD-10-CM | POA: Diagnosis not present

## 2017-04-12 NOTE — Progress Notes (Signed)
Cynthia Walsh New Patient Consult   Referring MD: Miki Labuda 56 y.o.  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 The lesion did not change in size. There were no other lesions. She was seen by her primary care provider and referred to Dr. Harlow Asa. She underwent excision of the mass on 03/03/2017. 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 Microscopically the mass contained numerous histiocytes with a background of lymphocytes and plasma cells. Immunohistochemical stains argued against lymphoma or leukemia.  She felt well prior to and following the biopsy. She is referred for oncology evaluation.  Past Medical History:  Diagnosis Date  . Depression   . Diabetes mellitus without complication ([place])    pre diabetic  . Fibroid   . Full body hives    hx 2012  . Hypertension   . Irregular bleeding   . UTI (urinary tract infection)    history of UTI     . Hyperlipidemia   . G4 P0, 2 miscarriages, 2 abortions   .  History of atrial fibrillation   .  Asthma  Past Surgical History:  Procedure Laterality Date  . DILATATION & CURRETTAGE/HYSTEROSCOPY WITH RESECTOCOPE N/A 06/15/2013   Procedure: [place];  Surgeon: Ena Dawley, MD;  Location: [facility];  Service: Gynecology;  Laterality: N/A;  . TONSILLECTOMY    . TUBAL LIGATION      Medications: Reviewed  Allergies:  Allergies  Allergen Reactions  . Ceclor [Cefaclor] Nausea And Vomiting  . Other Itching, Rash and Swelling    Most fruits   . Sulfa Drugs Cross Reactors Itching, Swelling and Rash  . Banana Flavor Hives  . Liraglutide Other (See Comments)    Other reaction(s): rash Other reaction(s): Other (See Comments)  . Minocycline Itching    Other reaction(s): unknown   . Peanut-Containing Drug  Products   . Shellfish Allergy   . Sulfa Antibiotics Itching  . Amlodipine Palpitations  . Fruit & Vegetable Daily [Nutritional Supplements] Rash    Family history: Her father had prostate cancer. A maternal on had breast cancer. A brother had lung cancer and was a smoker.  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 She lives with her husband. She does not use cigarettes. She drinks wine occasionally. No transfusion history. No risk factor for HIV or hepatitis.  ROS:   Positives include: Occasional night sweats, one episode of chills 6 months ago, vaginal bleeding following intercourse (she reports this has been evaluated by gynecology)   A complete ROS was otherwise negative.  Physical Exam:  Blood pressure (!) 156/99, pulse 85, temperature 99.2 F (37.3 C), temperature source Oral, resp. rate 19, height 5\' 2"  (1.575 m), weight 254 lb 14.4 oz (115.6 kg), SpO2 98 %.  HEENT: Oral cavity without visible mass, I could not visualize the pharynx, neck without mass Lungs: Distant breath sounds, no respiratory distress Cardiac: Regular rate and rhythm Abdomen: No hepatosplenomegaly, nontender, masslike fullness on deep palpation in the suprapubic region (she reports this has been present chronically secondary to uterine fibroids)  Vascular: No leg edema Lymph nodes: No cervical, supraclavicular, axillary, or inguinal nodes Neurologic: Alert and oriented, the motor exam appears intact in the upper and lower extremities Skin: Healed incision at the right upper arm without evidence of remaining  tumor, brown flat areas of hyperpigmentation scattered over the abdominal wall Musculoskeletal: No spine tenderness   LAB:  To be obtained 04/18/2017  Assessment/Plan:   1. Rosai-Dorfman disease  Excisional biopsy of a right upper arm mass 03/03/2017 with histiocytes and a background of lymphocyte/plasma cells,  2.   multiple allergies, history of  urticaria  3.   Diabetes  4.   Hypertension   Disposition:   Ms. Deveney has been diagnosed with Rosai-Dorfman disease involving a right arm cutaneous mass. There are no other apparent soft tissue masses on examination today. She has no palpable lymphadenopathy and no systemic symptoms to suggest multifocal disease or an associated inflammatory condition. It is possible the Rosai-Dorfman disease is associated with her history of urticaria.   We will check a CBC, hepatitis serologies, HIV, and erythrocyte sedimentation rate. We discussed the indication for staging CT scans. She is most comfortable with a laboratory only evaluation for now. We discussed referring her to an academic center given the rarity of this condition. She plans to follow-up here.  She will return for an office visit in 9 months. She will contact us in the interim for new symptoms.  50 minutes were spent with the patient today. The majority of the time was used for counseling and coordination of care.   Cynthia Romberg, MD  04/12/2017, 2:09 PM

## 2017-04-13 ENCOUNTER — Telehealth: Payer: Self-pay | Admitting: Oncology

## 2017-04-13 NOTE — Telephone Encounter (Signed)
Scheduled 9/18 los - left message with appt date and time and sent reminder letter in the mail.

## 2017-04-18 ENCOUNTER — Telehealth: Payer: Self-pay

## 2017-04-18 ENCOUNTER — Other Ambulatory Visit: Payer: Self-pay

## 2017-04-18 ENCOUNTER — Other Ambulatory Visit (HOSPITAL_BASED_OUTPATIENT_CLINIC_OR_DEPARTMENT_OTHER): Payer: 59

## 2017-04-18 DIAGNOSIS — D763 Other histiocytosis syndromes: Secondary | ICD-10-CM

## 2017-04-18 DIAGNOSIS — I10 Essential (primary) hypertension: Secondary | ICD-10-CM

## 2017-04-18 LAB — COMPREHENSIVE METABOLIC PANEL
ALT: 20 U/L (ref 0–55)
AST: 19 U/L (ref 5–34)
Albumin: 4 g/dL (ref 3.5–5.0)
Alkaline Phosphatase: 108 U/L (ref 40–150)
Anion Gap: 9 mEq/L (ref 3–11)
BUN: 11 mg/dL (ref 7.0–26.0)
CHLORIDE: 104 meq/L (ref 98–109)
CO2: 25 meq/L (ref 22–29)
Calcium: 10.3 mg/dL (ref 8.4–10.4)
Creatinine: 1 mg/dL (ref 0.6–1.1)
EGFR: 72 mL/min/{1.73_m2} — ABNORMAL LOW (ref >90)
GLUCOSE: 91 mg/dL (ref 70–140)
Potassium: 4 mEq/L (ref 3.5–5.1)
SODIUM: 138 meq/L (ref 136–145)
Total Bilirubin: 0.32 mg/dL (ref 0.20–1.20)
Total Protein: 8.8 g/dL — ABNORMAL HIGH (ref 6.4–8.3)

## 2017-04-18 LAB — LACTATE DEHYDROGENASE: LDH: 192 U/L (ref 125–245)

## 2017-04-18 NOTE — Telephone Encounter (Signed)
Call placed to patient and informed her that her lab specimen that was done today needs to be recollected. Pt verbalizes understanding and agrees with plan of care. Message sent to scheduling department.

## 2017-04-19 ENCOUNTER — Telehealth: Payer: Self-pay | Admitting: Oncology

## 2017-04-19 ENCOUNTER — Other Ambulatory Visit: Payer: Self-pay | Admitting: *Deleted

## 2017-04-19 DIAGNOSIS — D763 Other histiocytosis syndromes: Secondary | ICD-10-CM

## 2017-04-19 LAB — HEPATITIS B CORE ANTIBODY, TOTAL: HEP B C TOTAL AB: NEGATIVE

## 2017-04-19 LAB — HEPATITIS B SURFACE ANTIGEN: HEP B S AG: NEGATIVE

## 2017-04-19 LAB — IGG, IGA, IGM
IgA, Qn, Serum: 218 mg/dL (ref 87–352)
IgG, Qn, Serum: 2263 mg/dL — ABNORMAL HIGH (ref 700–1600)
IgM, Qn, Serum: 58 mg/dL (ref 26–217)

## 2017-04-19 LAB — HIV ANTIBODY (ROUTINE TESTING W REFLEX): HIV Screen 4th Generation wRfx: NONREACTIVE

## 2017-04-19 LAB — HEPATITIS C ANTIBODY: Hep C Virus Ab: <0.1 s/co ratio (ref 0.0–0.9)

## 2017-04-19 NOTE — Telephone Encounter (Signed)
Left voicemail for patient regarding her appt in October. Added per 9/24 sch msg. Also sending a confirmation letter in the mail.

## 2017-05-03 ENCOUNTER — Other Ambulatory Visit: Payer: 59

## 2017-05-09 DIAGNOSIS — N390 Urinary tract infection, site not specified: Secondary | ICD-10-CM | POA: Diagnosis not present

## 2017-05-09 DIAGNOSIS — R3129 Other microscopic hematuria: Secondary | ICD-10-CM | POA: Diagnosis not present

## 2017-05-09 MED FILL — TRULICITY 1.5 MG/0.5 ML PEN: 1.5 | 28 days supply | Qty: 2 | Fill #7

## 2017-05-09 MED FILL — DOXEPIN 10 MG CAPSULE: 10 | 30 days supply | Qty: 90 | Fill #1

## 2017-05-17 DIAGNOSIS — Z79899 Other long term (current) drug therapy: Secondary | ICD-10-CM | POA: Diagnosis not present

## 2017-05-17 DIAGNOSIS — E041 Nontoxic single thyroid nodule: Secondary | ICD-10-CM | POA: Diagnosis not present

## 2017-05-17 DIAGNOSIS — Z23 Encounter for immunization: Secondary | ICD-10-CM | POA: Diagnosis not present

## 2017-05-17 DIAGNOSIS — E119 Type 2 diabetes mellitus without complications: Secondary | ICD-10-CM | POA: Diagnosis not present

## 2017-05-17 DIAGNOSIS — Z5181 Encounter for therapeutic drug level monitoring: Secondary | ICD-10-CM | POA: Diagnosis not present

## 2017-05-19 ENCOUNTER — Other Ambulatory Visit: Payer: Self-pay | Admitting: Cardiovascular Disease

## 2017-05-19 MED FILL — HYDROCHLOROTHIAZIDE 12.5 MG: 12.5 | 90 days supply | Qty: 90 | Fill #0

## 2017-05-19 MED FILL — METFORMIN HCL ER 500 MG TAB: 500 | 90 days supply | Qty: 180 | Fill #1

## 2017-05-19 MED FILL — BENAZEPRIL HCL 40 MG TABLET: 40 | 90 days supply | Qty: 90 | Fill #2

## 2017-06-13 ENCOUNTER — Other Ambulatory Visit: Payer: Self-pay | Admitting: Allergy and Immunology

## 2017-06-13 DIAGNOSIS — L309 Dermatitis, unspecified: Secondary | ICD-10-CM

## 2017-06-13 MED FILL — hydrOXYzine HCL 25 MG TABS: 25 | 30 days supply | Qty: 240 | Fill #2

## 2017-06-13 MED FILL — TRULICITY 1.5 MG/0.5 ML PEN: 1.5 | 28 days supply | Qty: 2 | Fill #8

## 2017-06-13 MED FILL — DOXEPIN 10 MG CAPSULE: 10 | 30 days supply | Qty: 90 | Fill #0

## 2017-06-20 DIAGNOSIS — N950 Postmenopausal bleeding: Secondary | ICD-10-CM | POA: Diagnosis not present

## 2017-06-20 DIAGNOSIS — N939 Abnormal uterine and vaginal bleeding, unspecified: Secondary | ICD-10-CM | POA: Diagnosis not present

## 2017-06-20 DIAGNOSIS — L309 Dermatitis, unspecified: Secondary | ICD-10-CM | POA: Diagnosis not present

## 2017-06-20 MED FILL — DOXYCYCLINE HYCLATE 100 MG: 100 | 10 days supply | Qty: 20 | Fill #0

## 2017-07-11 DIAGNOSIS — F418 Other specified anxiety disorders: Secondary | ICD-10-CM | POA: Diagnosis not present

## 2017-07-11 DIAGNOSIS — E119 Type 2 diabetes mellitus without complications: Secondary | ICD-10-CM | POA: Diagnosis not present

## 2017-07-11 DIAGNOSIS — I10 Essential (primary) hypertension: Secondary | ICD-10-CM | POA: Diagnosis not present

## 2017-07-11 DIAGNOSIS — L209 Atopic dermatitis, unspecified: Secondary | ICD-10-CM | POA: Diagnosis not present

## 2017-07-11 DIAGNOSIS — Z7984 Long term (current) use of oral hypoglycemic drugs: Secondary | ICD-10-CM | POA: Diagnosis not present

## 2017-07-11 DIAGNOSIS — R229 Localized swelling, mass and lump, unspecified: Secondary | ICD-10-CM | POA: Diagnosis not present

## 2017-07-11 DIAGNOSIS — Z6841 Body Mass Index (BMI) 40.0 and over, adult: Secondary | ICD-10-CM | POA: Diagnosis not present

## 2017-07-11 MED FILL — ESCITALOPRAM 20 MG TABLET: 20 | 90 days supply | Qty: 90 | Fill #0

## 2017-07-11 MED FILL — MOMETASONE FUROATE 0.1% CRM: 0.1 | 30 days supply | Qty: 45 | Fill #0

## 2017-07-13 MED FILL — DOXEPIN 10 MG CAPSULE: 10 | 90 days supply | Qty: 270 | Fill #0 | Status: TO

## 2017-07-13 MED FILL — TRULICITY 1.5 MG/0.5 ML PEN: 1.5 | 28 days supply | Qty: 2 | Fill #9

## 2017-07-15 MED FILL — ALPRAZolam 0.25 MG TABS: 0.25 | 30 days supply | Qty: 60 | Fill #0

## 2017-07-20 DIAGNOSIS — Z6841 Body Mass Index (BMI) 40.0 and over, adult: Secondary | ICD-10-CM | POA: Diagnosis not present

## 2017-07-20 DIAGNOSIS — N840 Polyp of corpus uteri: Secondary | ICD-10-CM | POA: Diagnosis not present

## 2017-07-20 DIAGNOSIS — N950 Postmenopausal bleeding: Secondary | ICD-10-CM | POA: Diagnosis not present

## 2017-07-20 DIAGNOSIS — D259 Leiomyoma of uterus, unspecified: Secondary | ICD-10-CM | POA: Diagnosis not present

## 2017-07-20 DIAGNOSIS — N939 Abnormal uterine and vaginal bleeding, unspecified: Secondary | ICD-10-CM | POA: Diagnosis not present

## 2017-07-21 ENCOUNTER — Other Ambulatory Visit: Payer: Self-pay | Admitting: Obstetrics and Gynecology

## 2017-07-21 DIAGNOSIS — N939 Abnormal uterine and vaginal bleeding, unspecified: Secondary | ICD-10-CM | POA: Diagnosis not present

## 2017-08-26 ENCOUNTER — Other Ambulatory Visit: Payer: Self-pay | Admitting: Obstetrics and Gynecology

## 2017-08-29 MED FILL — MOMETASONE FUROATE 0.1% CRM: 0.1 | 30 days supply | Qty: 45 | Fill #1

## 2017-08-29 MED FILL — METFORMIN HCL ER 500 MG TAB: 500 | 30 days supply | Qty: 60 | Fill #2

## 2017-08-29 MED FILL — TRULICITY 1.5 MG/0.5 ML PEN: 1.5 | 28 days supply | Qty: 2 | Fill #10

## 2017-08-29 MED FILL — BENAZEPRIL HCL 40 MG TABLET: 40 | 30 days supply | Qty: 30 | Fill #3

## 2017-09-19 ENCOUNTER — Encounter (HOSPITAL_COMMUNITY): Payer: Self-pay | Admitting: *Deleted

## 2017-09-28 ENCOUNTER — Other Ambulatory Visit: Payer: Self-pay | Admitting: Obstetrics and Gynecology

## 2017-09-30 MED FILL — BENAZEPRIL HCL 40 MG TABLET: 40 | 30 days supply | Qty: 30 | Fill #4

## 2017-09-30 MED FILL — TRULICITY 1.5 MG/0.5 ML PEN: 1.5 | 28 days supply | Qty: 2 | Fill #11

## 2017-09-30 MED FILL — hydrOXYzine HCL 25 MG TABS: 25 | 30 days supply | Qty: 240 | Fill #3

## 2017-09-30 MED FILL — METFORMIN HCL ER 500 MG TAB: 500 | 30 days supply | Qty: 60 | Fill #0 | Status: TO

## 2017-11-03 ENCOUNTER — Encounter (HOSPITAL_BASED_OUTPATIENT_CLINIC_OR_DEPARTMENT_OTHER): Payer: Self-pay

## 2017-11-03 ENCOUNTER — Other Ambulatory Visit: Payer: Self-pay

## 2017-11-03 NOTE — Progress Notes (Signed)
Spoke with:  Cynthia Walsh NPO: No food after midnight/Clear liquids until 7:30AM DOS Arrival time:  1130AM Labs:  UPT ordered but patients states she postmenopausal AM medications:  Cetrizine, Escitalopram, Ranitidine Pre op orders:  Yes Ride home:  Marylee Floras (husband) [phone]

## 2017-11-07 ENCOUNTER — Encounter (HOSPITAL_COMMUNITY): Admission: RE | Admit: 2017-11-07 | Payer: 59 | Source: Ambulatory Visit

## 2017-11-17 ENCOUNTER — Ambulatory Visit (HOSPITAL_BASED_OUTPATIENT_CLINIC_OR_DEPARTMENT_OTHER): Admission: RE | Admit: 2017-11-17 | Payer: 59 | Source: Ambulatory Visit | Admitting: Obstetrics and Gynecology

## 2017-11-17 HISTORY — DX: Cardiomegaly: I51.7

## 2017-11-17 HISTORY — DX: Other histiocytosis syndromes: D76.3

## 2017-11-17 HISTORY — DX: Hyperlipidemia, unspecified: E78.5

## 2017-11-17 HISTORY — DX: Other allergic rhinitis: J30.89

## 2017-11-17 HISTORY — DX: Nontoxic single thyroid nodule: E04.1

## 2017-11-17 HISTORY — DX: Heart disease, unspecified: I51.9

## 2017-11-17 HISTORY — DX: Other ill-defined heart diseases: I51.89

## 2017-11-17 HISTORY — DX: Obesity, unspecified: E66.9

## 2017-11-17 HISTORY — DX: Localized edema: R60.0

## 2017-11-17 HISTORY — DX: Anemia, unspecified: D64.9

## 2017-11-17 HISTORY — DX: Personal history of other specified conditions: Z87.898

## 2017-11-17 HISTORY — DX: Other urticaria: L50.8

## 2017-11-17 HISTORY — DX: Heart failure, unspecified: I50.9

## 2017-11-17 HISTORY — DX: Gastro-esophageal reflux disease without esophagitis: K21.9

## 2017-11-17 HISTORY — DX: Paroxysmal atrial fibrillation: I48.0

## 2017-11-17 SURGERY — DILATATION & CURETTAGE/HYSTEROSCOPY WITH RESECTOCOPE
Anesthesia: Choice

## 2017-12-12 ENCOUNTER — Telehealth: Payer: Self-pay | Admitting: Oncology

## 2017-12-12 NOTE — Telephone Encounter (Signed)
Call day, rescheduled pt from 6/18 to 6/27.

## 2018-01-10 ENCOUNTER — Ambulatory Visit: Payer: 59 | Admitting: Oncology

## 2018-01-19 ENCOUNTER — Inpatient Hospital Stay: Payer: 59 | Attending: Oncology | Admitting: Oncology

## 2018-01-19 VITALS — BP 152/70 | HR 73 | Temp 98.5°F | Resp 17 | Ht 62.0 in | Wt 266.3 lb

## 2018-01-19 DIAGNOSIS — E119 Type 2 diabetes mellitus without complications: Secondary | ICD-10-CM

## 2018-01-19 DIAGNOSIS — I10 Essential (primary) hypertension: Secondary | ICD-10-CM | POA: Diagnosis not present

## 2018-01-19 DIAGNOSIS — D763 Other histiocytosis syndromes: Secondary | ICD-10-CM | POA: Insufficient documentation

## 2018-01-19 NOTE — Progress Notes (Signed)
  [facility] OFFICE PROGRESS NOTE   Diagnosis: Rosai-Dorfman  INTERVAL HISTORY:   Cynthia Walsh returns as scheduled.  She feels well.  Good appetite and energy level.  She has hot flashes that she relates to menopause.  No fever or night sweats.  No change at the right arm scar.  Objective:  Vital signs in last 24 hours:  Blood pressure (!) 152/70, pulse 73, temperature 98.5 F (36.9 C), temperature source Oral, resp. rate 17, height 5\' 2"  (1.575 m), weight 266 lb 4.8 oz (120.8 kg), last menstrual period 06/11/2013, SpO2 99 %.    HEENT: Neck without mass Lymphatics: No cervical, supraclavicular, axillary, or inguinal nodes Resp: Lungs clear bilaterally Cardio: Regular rate and rhythm GI: No hepatosplenomegaly, nontender, no mass Vascular: No leg edema  Skin: Approximately 3 cm superior to the right upper arm scar there is a 1 cm firm cutaneous nodule.  The nodule is oval and slightly mobile within the subcutaneous tissue   Lab Results:  Lab Results  Component Value Date   WBC 6.7 01/19/2016   HGB 13.6 01/19/2016   HCT 41.2 01/19/2016   MCV 78.5 (L) 01/19/2016   PLT 338 01/19/2016   NEUTROABS 2,814 01/19/2016    CMP  Lab Results  Component Value Date   NA 138 04/18/2017   K 4.0 04/18/2017   CL 102 01/19/2016   CO2 25 04/18/2017   GLUCOSE 91 04/18/2017   BUN 11.0 04/18/2017   CREATININE 1.0 04/18/2017   CALCIUM 10.3 04/18/2017   PROT 8.8 (H) 04/18/2017   ALBUMIN 4.0 04/18/2017   AST 19 04/18/2017   ALT 20 04/18/2017   ALKPHOS 108 04/18/2017   BILITOT 0.32 04/18/2017   GFRNONAA 83 (L) 06/15/2013   GFRAA >90 06/15/2013    Medications: I have reviewed the patient's current medications.   Assessment/Plan:  1. Rosai-Dorfman disease ? Excisional biopsy of a right upper arm mass 03/03/2017 with histiocytes and a background of lymphocyte/plasma cells,  2.   multiple allergies, history of urticaria  3.   Diabetes  4.    Hypertension   Disposition: Cynthia Walsh appears well.  There is no evidence for systemic progression of the Rosai-Dorfman disease. Hepatitis serologies and and HIV were negative when she was here in September 2018.  The IgG level was elevated.  This may be related to her history of allergies.  We will check a serum protein electrophoresis and immunofixation at the next visit.  There is a firm nodule superior to the right arm scar.  This may be related to fat necrosis, a lipoma, or a benign cyst.  She will return for a repeat exam of this area in 3 months.  She will contact us in the interim if the lesion changes. Cynthia Coder, MD  01/19/2018  8:15 AM

## 2018-04-24 ENCOUNTER — Inpatient Hospital Stay: Payer: 59 | Attending: Oncology | Admitting: Oncology

## 2018-04-24 ENCOUNTER — Inpatient Hospital Stay: Payer: 59

## 2018-04-24 VITALS — BP 156/88 | HR 79 | Temp 97.7°F | Resp 18 | Ht 62.0 in | Wt 267.6 lb

## 2018-04-24 DIAGNOSIS — D763 Other histiocytosis syndromes: Secondary | ICD-10-CM | POA: Insufficient documentation

## 2018-04-24 DIAGNOSIS — I10 Essential (primary) hypertension: Secondary | ICD-10-CM | POA: Diagnosis not present

## 2018-04-24 DIAGNOSIS — Z23 Encounter for immunization: Secondary | ICD-10-CM | POA: Diagnosis not present

## 2018-04-24 DIAGNOSIS — E119 Type 2 diabetes mellitus without complications: Secondary | ICD-10-CM

## 2018-04-24 MED ORDER — INFLUENZA VAC SPLIT QUAD 0.5 ML IM SUSY
PREFILLED_SYRINGE | INTRAMUSCULAR | Status: AC
  Filled 2018-04-24: qty 0.5

## 2018-04-24 MED ORDER — INFLUENZA VAC SPLIT QUAD 0.5 ML IM SUSY
0.5000 mL | PREFILLED_SYRINGE | Freq: Once | INTRAMUSCULAR | Status: AC
  Administered 2018-04-24: 0.5 mL via INTRAMUSCULAR

## 2018-04-24 NOTE — Progress Notes (Signed)
  [facility] OFFICE PROGRESS NOTE   Diagnosis: Rosai-Dorfman disease  INTERVAL HISTORY:   Cynthia Walsh returns as scheduled.  She feels well.  She has noted enlargement of the right arm nodule.  No other complaint.  No fever.  No other palpable nodules.  Objective:  Vital signs in last 24 hours:  Blood pressure (!) 156/88, pulse 79, temperature 97.7 F (36.5 C), temperature source Oral, resp. rate 18, height 5\' 2"  (1.575 m), weight 267 lb 9.6 oz (121.4 kg), last menstrual period 06/11/2013, SpO2 100 %.    HEENT: Neck without mass Lymphatics: No cervical, supraclavicular, axillary, or inguinal nodes Resp: Lungs clear bilaterally Cardio: Regular rate and rhythm GI: No hepatosplenomegaly, no mass Vascular: No leg edema  Skin: There is a 1.5 cm mobile cutaneous nodular lesion superior to the mid aspect of the right arm scar   Lab Results:   Medications: I have reviewed the patient's current medications.   Assessment/Plan: 1. Rosai-Dorfman disease ? Excisional biopsy of a right upper arm mass 03/03/2017 with histiocytes and a background of lymphocyte/plasma cells,  2.   multiple allergies, history of urticaria  3.   Diabetes  4.   Hypertension    Disposition: Cynthia Walsh appears well.  The nodular lesion superior to the right arm scar appears slightly larger today.  I will refer her to Dr. Harlow Asa for an excisional biopsy.  We will follow-up on the myeloma panel from today. She will return for an office visit in approximately 1 month.  Betsy Coder, MD  04/24/2018  4:57 PM

## 2018-04-25 ENCOUNTER — Telehealth: Payer: Self-pay | Admitting: Oncology

## 2018-04-25 LAB — MULTIPLE MYELOMA PANEL, SERUM
ALBUMIN/GLOB SERPL: 1 (ref 0.7–1.7)
ALPHA 1: 0.2 g/dL (ref 0.0–0.4)
ALPHA2 GLOB SERPL ELPH-MCNC: 0.6 g/dL (ref 0.4–1.0)
Albumin SerPl Elph-Mcnc: 3.7 g/dL (ref 2.9–4.4)
B-Globulin SerPl Elph-Mcnc: 1.2 g/dL (ref 0.7–1.3)
GAMMA GLOB SERPL ELPH-MCNC: 1.8 g/dL (ref 0.4–1.8)
Globulin, Total: 3.8 g/dL (ref 2.2–3.9)
IGA: 201 mg/dL (ref 87–352)
IGG (IMMUNOGLOBIN G), SERUM: 2011 mg/dL — AB (ref 700–1600)
IGM (IMMUNOGLOBULIN M), SRM: 39 mg/dL (ref 26–217)
TOTAL PROTEIN ELP: 7.5 g/dL (ref 6.0–8.5)

## 2018-04-25 NOTE — Telephone Encounter (Signed)
Appts scheduled patient notified per 9/30 los

## 2018-05-23 ENCOUNTER — Ambulatory Visit: Payer: 59 | Admitting: Oncology

## 2018-06-19 ENCOUNTER — Other Ambulatory Visit: Payer: Self-pay | Admitting: Surgery

## 2018-07-12 ENCOUNTER — Other Ambulatory Visit: Payer: Self-pay | Admitting: Nurse Practitioner

## 2018-07-12 DIAGNOSIS — Z13820 Encounter for screening for osteoporosis: Secondary | ICD-10-CM

## 2018-07-12 DIAGNOSIS — M818 Other osteoporosis without current pathological fracture: Secondary | ICD-10-CM

## 2018-07-13 ENCOUNTER — Inpatient Hospital Stay: Payer: 59 | Attending: Oncology | Admitting: Oncology

## 2018-07-13 ENCOUNTER — Telehealth: Payer: Self-pay | Admitting: Oncology

## 2018-07-13 ENCOUNTER — Telehealth: Payer: Self-pay

## 2018-07-13 VITALS — BP 158/92 | HR 87 | Temp 98.3°F | Resp 18 | Ht 62.0 in

## 2018-07-13 DIAGNOSIS — E119 Type 2 diabetes mellitus without complications: Secondary | ICD-10-CM

## 2018-07-13 DIAGNOSIS — I10 Essential (primary) hypertension: Secondary | ICD-10-CM | POA: Insufficient documentation

## 2018-07-13 DIAGNOSIS — D763 Other histiocytosis syndromes: Secondary | ICD-10-CM | POA: Insufficient documentation

## 2018-07-13 NOTE — Telephone Encounter (Signed)
Faxed records to Dr. Eveline Keto office. [phone]. New pt scheduler will call pt with appt.

## 2018-07-13 NOTE — Progress Notes (Signed)
Called patient after her appointment to make her aware of low MCV-she is not aware of this being low in the past. MD will add smear/ferritin level check to her 4 month appointment. She understands and agrees to this plan.

## 2018-07-13 NOTE — Progress Notes (Signed)
  [facility] OFFICE PROGRESS NOTE   Diagnosis: Rosai-Dorfman disease  INTERVAL HISTORY:   Cynthia Walsh underwent excision of the right arm mass by Dr. Harlow Asa on 06/19/2018.  She reports tolerating the procedure well.  No fever, night sweats, or new palpable masses.  No complaint. The pathology from the soft tissue excision returned consistent with Rosai-Dorfman disease.  Objective:  Vital signs in last 24 hours:  Blood pressure (!) 171/111, pulse 87, temperature 98.3 F (36.8 C), temperature source Oral, resp. rate 18, height 5\' 2"  (1.575 m), last menstrual period 06/11/2013, SpO2 99 %.    HEENT: Neck without mass Lymphatics: No cervical, supraclavicular, axillary, or inguinal nodes Resp: Lungs clear bilaterally Cardio: Regular rate and rhythm GI: No hepatosplenomegaly, no mass, nontender Vascular: No leg edema  Skin: 2 scars at the right upper arm without evidence of recurrent tumor, no skin or soft tissue masses in the right arm   Lab Results:  Lab Results  Component Value Date   WBC 6.7 01/19/2016   HGB 13.6 01/19/2016   HCT 41.2 01/19/2016   MCV 78.5 (L) 01/19/2016   PLT 338 01/19/2016   NEUTROABS 2,814 01/19/2016    CMP  Lab Results  Component Value Date   NA 138 04/18/2017   K 4.0 04/18/2017   CL 102 01/19/2016   CO2 25 04/18/2017   GLUCOSE 91 04/18/2017   BUN 11.0 04/18/2017   CREATININE 1.0 04/18/2017   CALCIUM 10.3 04/18/2017   PROT 8.8 (H) 04/18/2017   ALBUMIN 4.0 04/18/2017   AST 19 04/18/2017   ALT 20 04/18/2017   ALKPHOS 108 04/18/2017   BILITOT 0.32 04/18/2017   GFRNONAA 83 (L) 06/15/2013   GFRAA >90 06/15/2013     Medications: I have reviewed the patient's current medications.   Assessment/Plan:  1. Rosai-Dorfman disease ? Excisional biopsy of a right upper arm mass 03/03/2017 with histiocytes and a background of lymphocyte/plasma cells ? Excisional biopsy of right upper extremity mass (superior to the 03/03/2017 scar)  consistent with Rosai-Dorfman disease  2.   multiple allergies, history of urticaria  3.   Diabetes  4.   Hypertension   Disposition: Cynthia Walsh appears stable.  The reexcision biopsy confirmed Rosai-Dorfman disease.  There is no clinical evidence of additional skin or other lesions.  She has no symptoms of a systemic illness.  I discussed the uncommon nature of her clinical presentation.  She agrees to a referral to the [place] [facility] for a second opinion regarding the need for additional staging.  Cynthia Walsh will return for an office visit here in 4 months.  I noticed her MCV has been low on repeat determinations in the past.  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  We will check a CBC and ferritin level when she returns in 4 months.  She reports her blood pressure is normal when checked at home.  Betsy Coder, MD  07/13/2018  8:15 AM

## 2018-07-13 NOTE — Telephone Encounter (Signed)
Printed avs and calender of upcoming appointment. Per 12/19 los 

## 2018-07-13 NOTE — Telephone Encounter (Signed)
FAXED OFFICE NOTES TO [facility] [place] [facility]. FAX [phone]

## 2018-09-04 ENCOUNTER — Other Ambulatory Visit: Payer: 59

## 2018-09-06 ENCOUNTER — Other Ambulatory Visit: Payer: 59

## 2018-10-20 ENCOUNTER — Other Ambulatory Visit: Payer: 59

## 2018-11-03 ENCOUNTER — Telehealth: Payer: Self-pay | Admitting: *Deleted

## 2018-11-03 NOTE — Telephone Encounter (Signed)
Left VM that L/OV 4/16 will be canceled due to COVID Precautions per Dr. Benay Spice. Scheduler will call to be seen in 2 months. Call back with any questions.

## 2018-11-09 ENCOUNTER — Other Ambulatory Visit: Payer: 59

## 2018-11-09 ENCOUNTER — Ambulatory Visit: Payer: 59 | Admitting: Oncology

## 2018-12-05 IMAGING — US US THYROID
1 series · 14 of 25 positions shown · non-contrast
Comparison: 04/11/2015

CLINICAL DATA: Prior ultrasound follow-up.  Follow-up nodule.

EXAM:
THYROID ULTRASOUND
TECHNIQUE: Ultrasound examination of the thyroid gland and adjacent soft
tissues was performed.

[Series 1: us thyroid · 0.05mm/px · 14 of 36 slices shown]
[im 1/36]
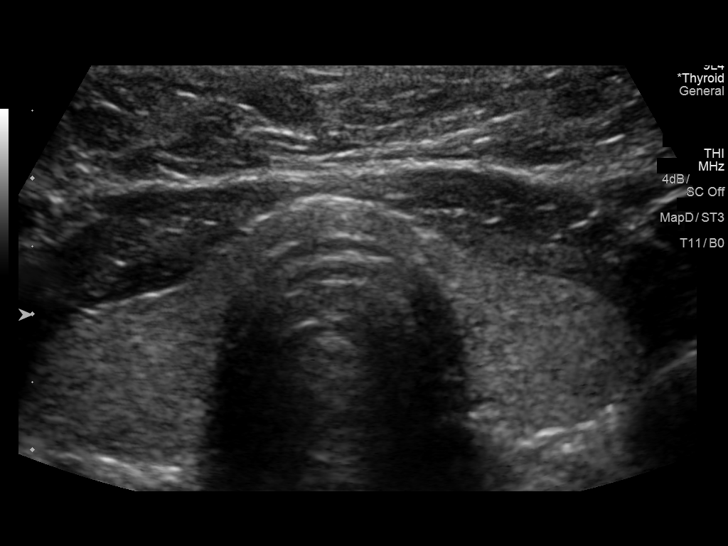
[im 3/36]
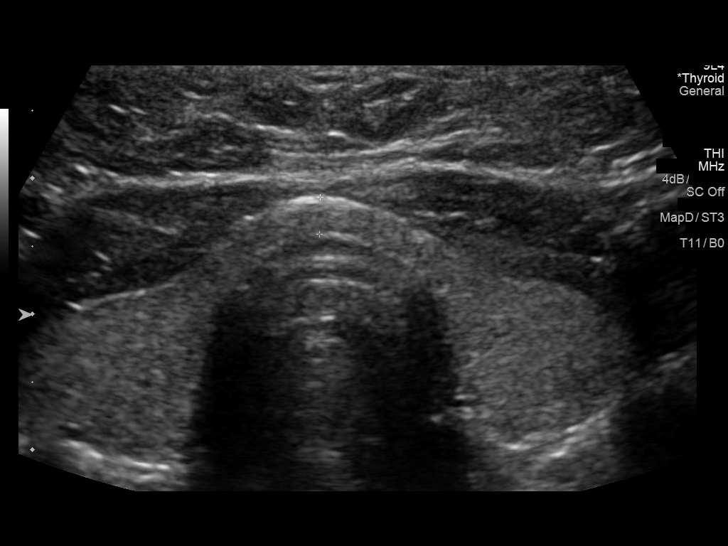
[im 6/36]
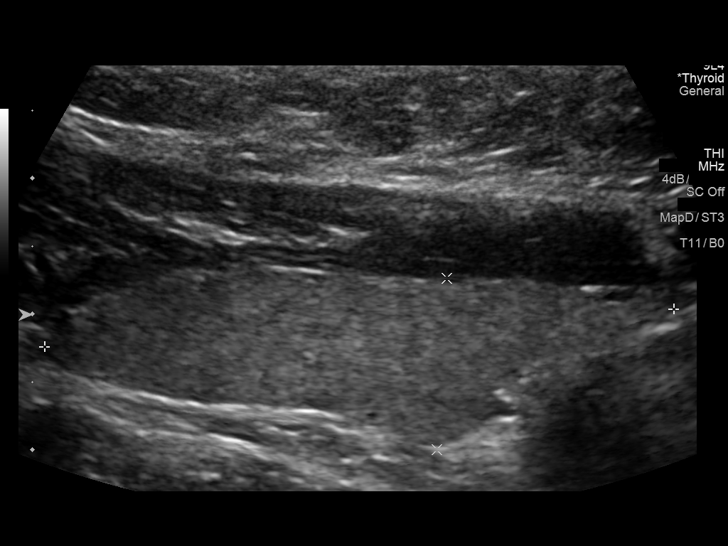
[im 9/36]
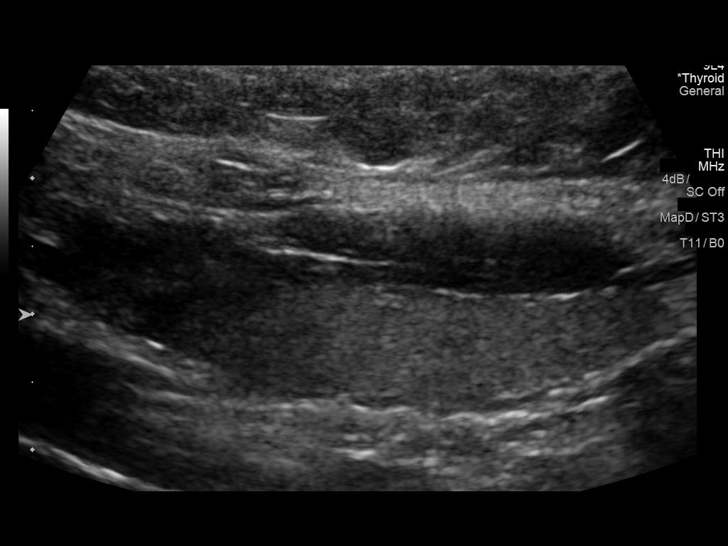
[im 12/36]
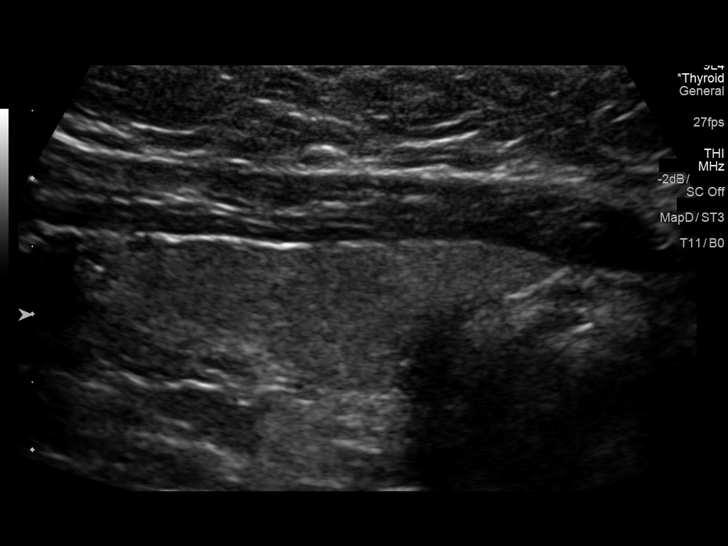
[im 14/36]
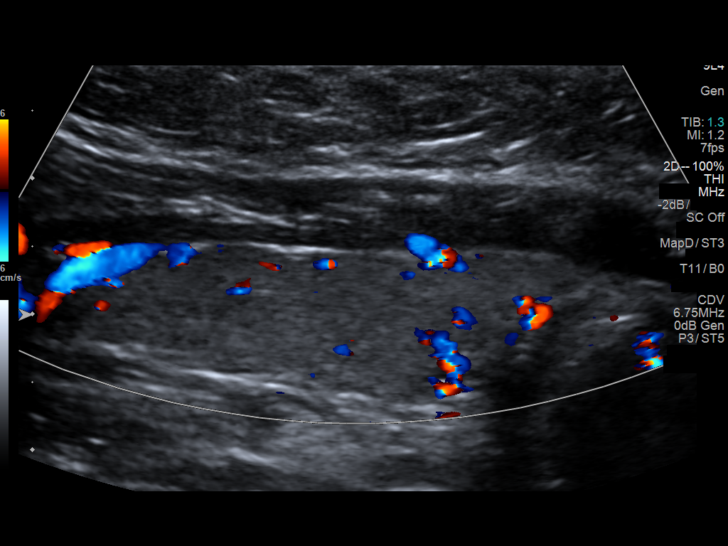
[im 17/36]
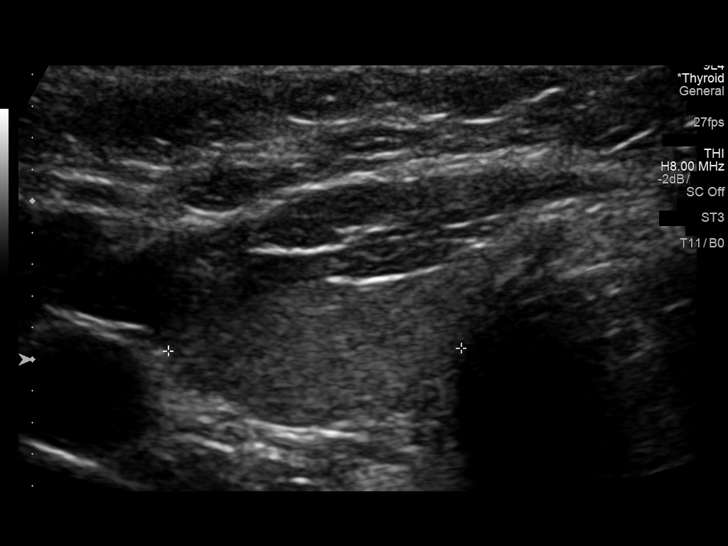
[im 19/36]
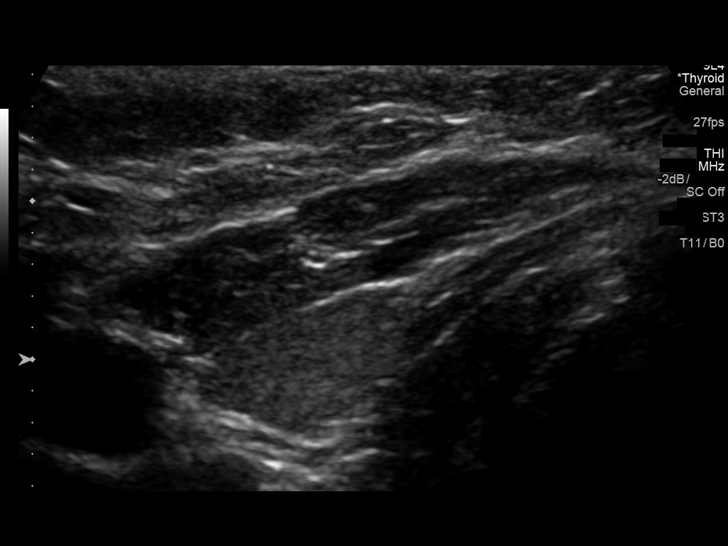
[im 22/36]
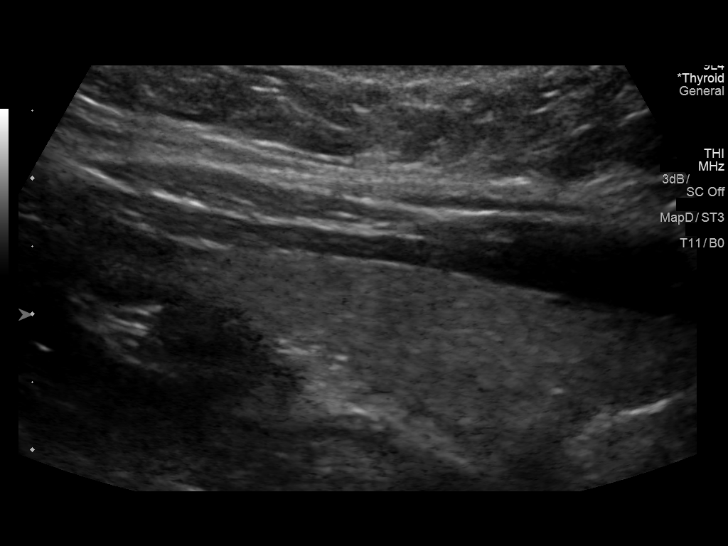
[im 24/36]
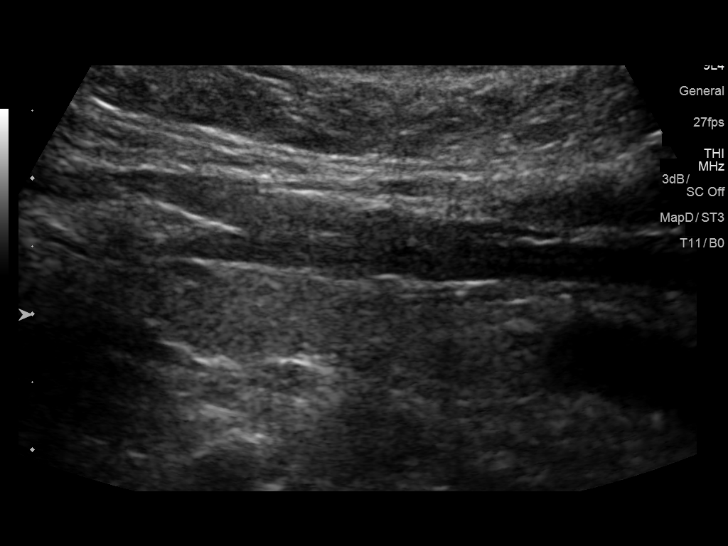
[im 27/36]
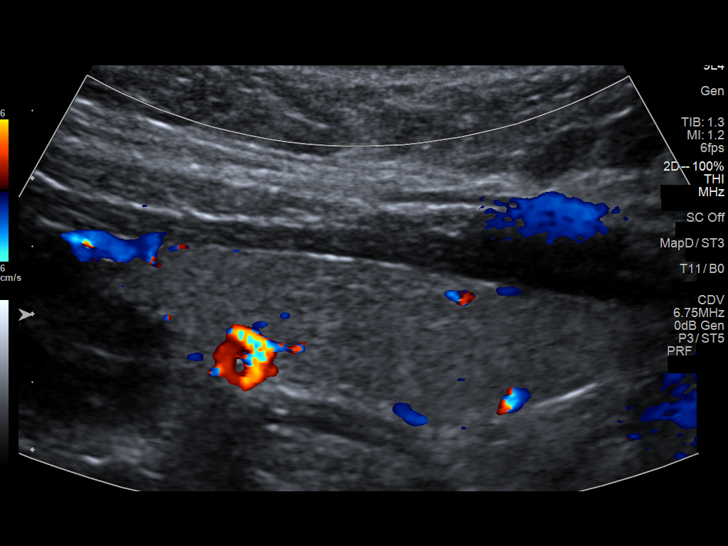
[im 30/36]
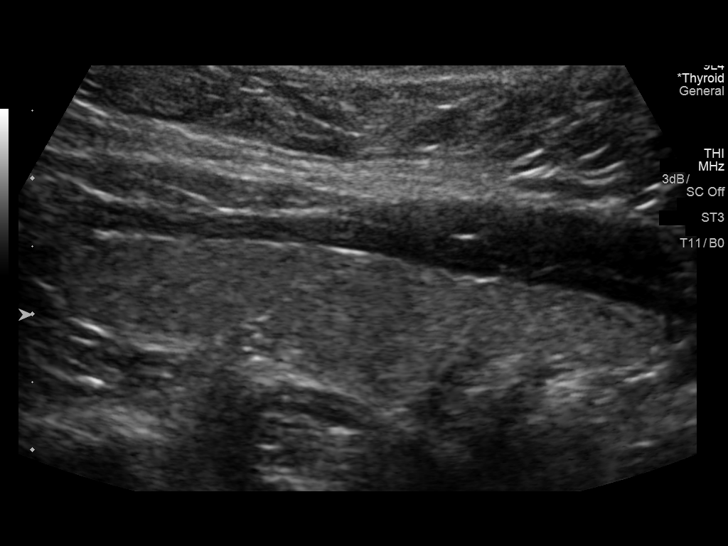
[im 33/36]
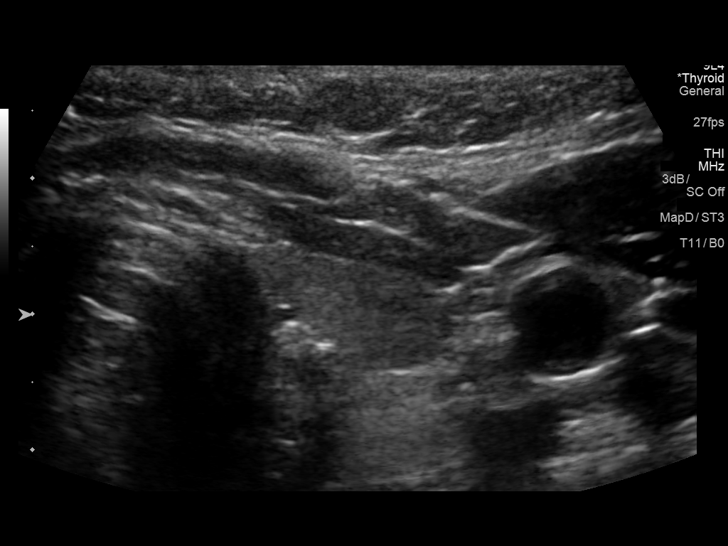
[im 36/36]
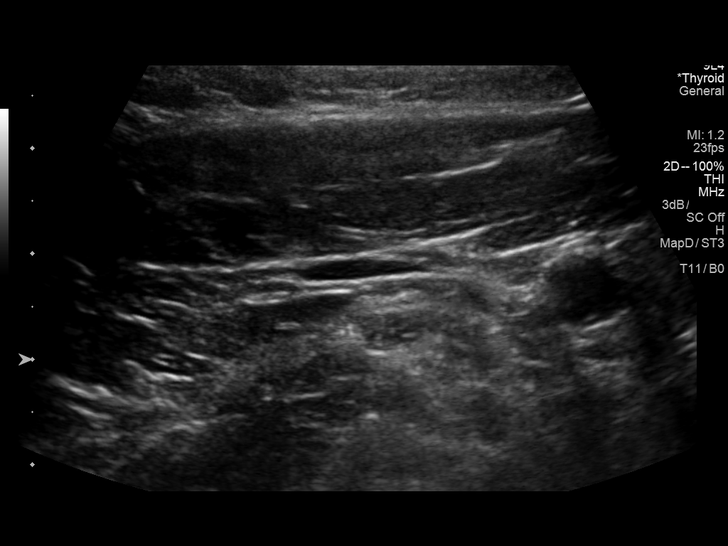

[14 of 25 positions shown; findings below may reference images not displayed]

FINDINGS: Parenchymal Echotexture: Normal

Isthmus: 0.2 cm, previously 0.2 cm

Right lobe: 4.6 x 1.2 x 1.8 cm, previously 4.6 x 1.2 x 1.5 cm

Left lobe: 4.4 x 1.1 x 1.5 cm, previously 4.3 x 1.5 x 1.1 cm

_________________________________________________________

Estimated total number of nodules >/= 1 cm: 0

Number of spongiform nodules >/=  2 cm not described below (TR1): 0

Number of mixed cystic and solid nodules >/= 1.5 cm not described
below (TR2): 0

_________________________________________________________

Ill-defined hypoechoic right mid posterior 0.5 cm nodule previously
measured 0.6 cm.
IMPRESSION: Small right-sided nodule has a benign appearance and is not
significantly changed. This study does not meet criteria for
follow-up or biopsy.

The above is in keeping with the ACR TI-RADS recommendations - [HOSPITAL] 6194;[DATE].

## 2018-12-05 IMAGING — US US EXTREM UP *R* LTD
1 series · 10 of 10 positions shown · non-contrast
Comparison: None

CLINICAL DATA: Right upper arm pain.

EXAM:
ULTRASOUND RIGHT UPPER EXTREMITY LIMITED
TECHNIQUE: Ultrasound examination of the upper extremity soft tissues was
performed in the area of clinical concern.

[Series 1: us extrem up *right* ltd · 0.06mm/px · 10 acquisitions, 10 frames shown]
[im 1/10]
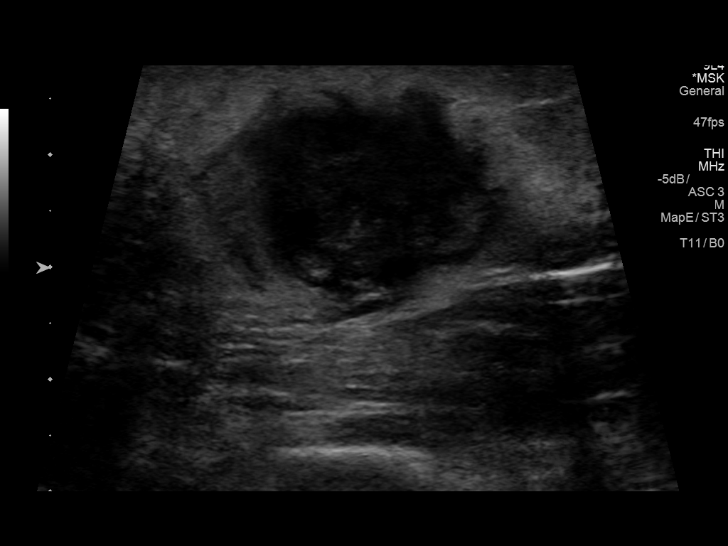
[im 2/10]
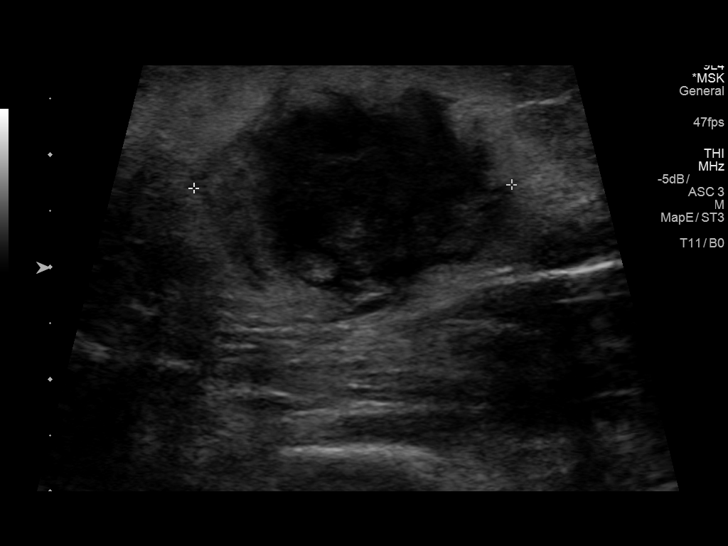
[im 3/10]
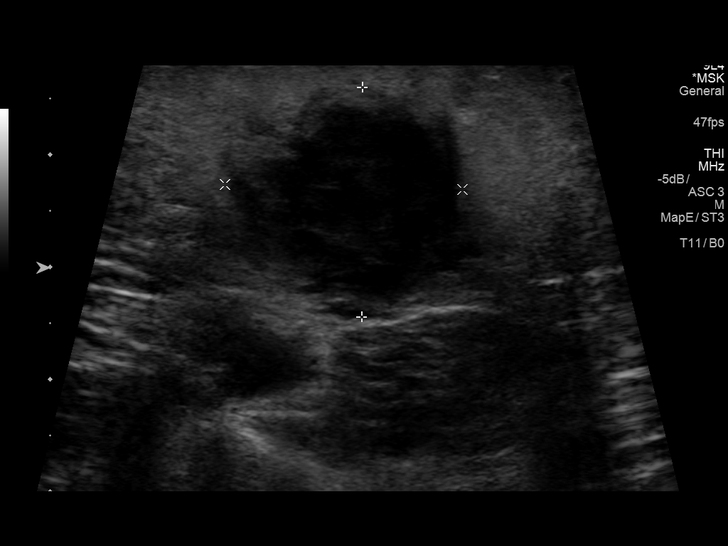
[im 4/10]
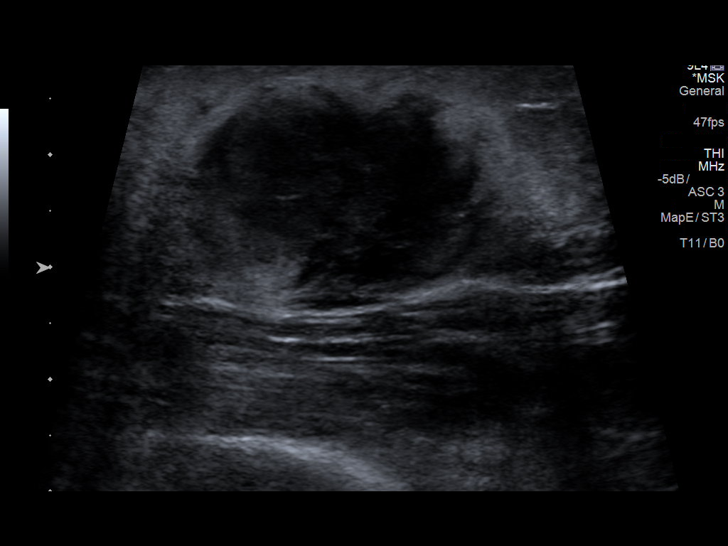
[im 5/10]
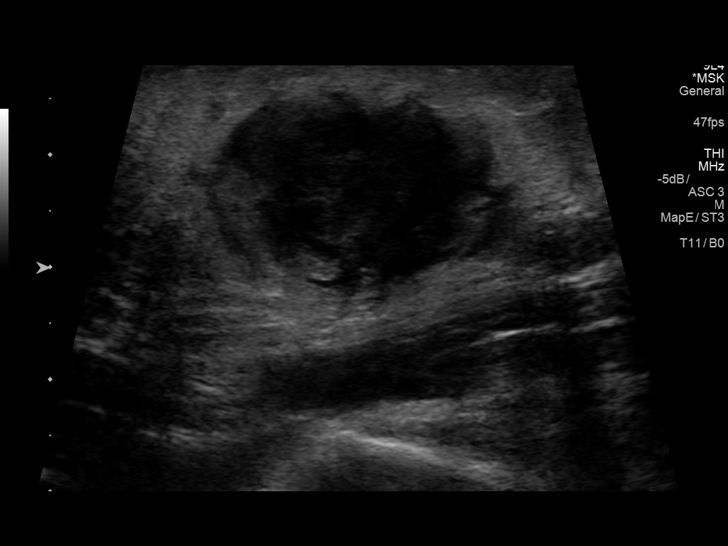
[im 6/10]
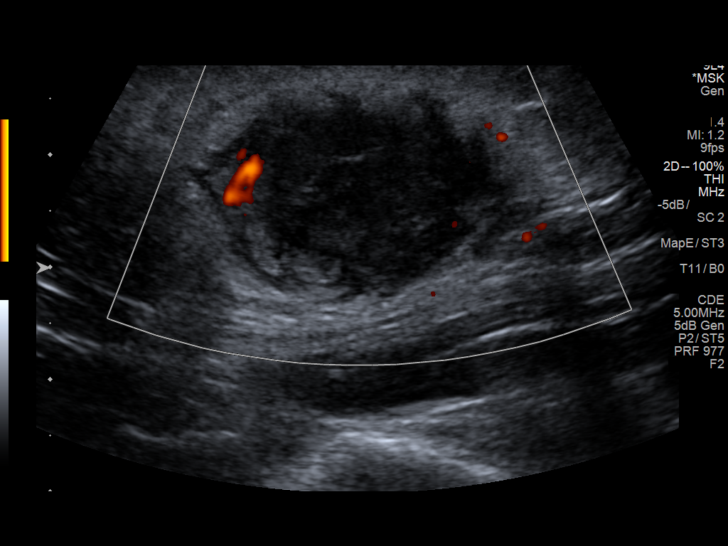
[im 7/10]
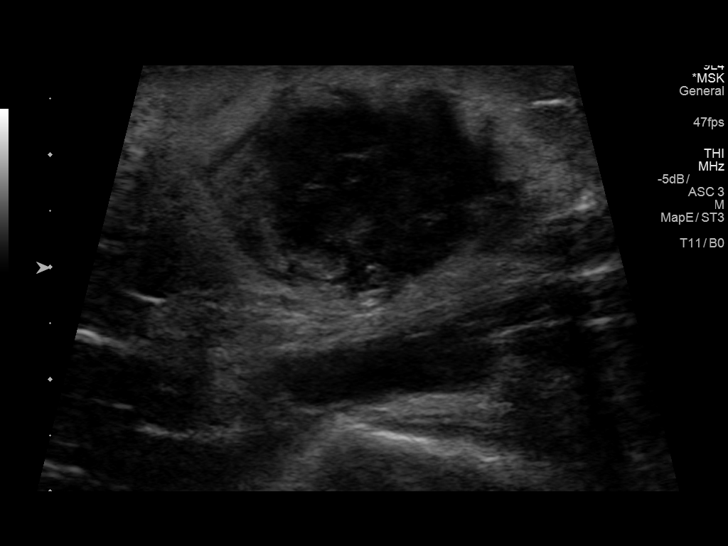
[im 8/10]
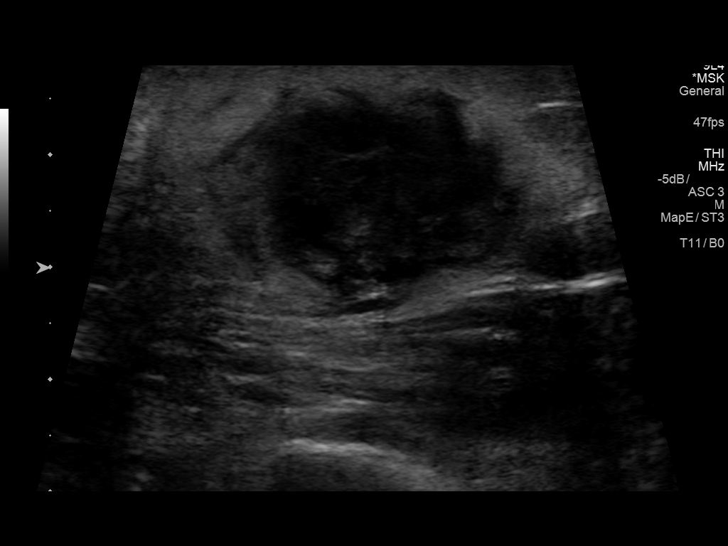
[im 9/10]
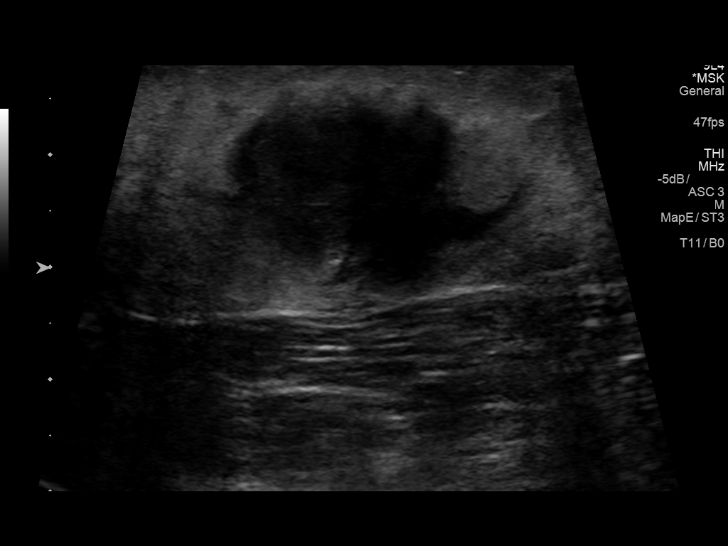
[im 10/10]
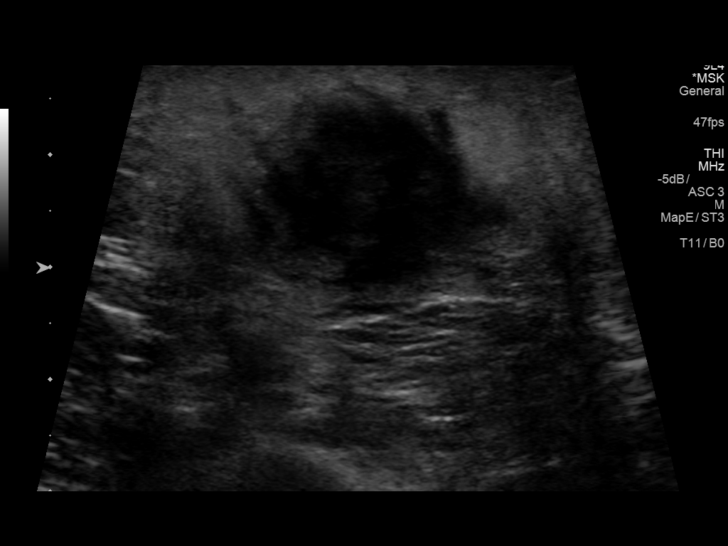

[10 of 10 positions shown; findings below may reference images not displayed]

FINDINGS: 2.8 x 2 x 2.1 cm hypoechoic mass in the subcutaneous fat overlying
the right biceps muscle with irregular margins. Small areas of
peripheral Doppler flow.

No other fluid collection or soft tissue mass.
IMPRESSION: 2.8 x 2 x 2.1 cm hypoechoic mass in the subcutaneous fat overlying
the right biceps muscle most concerning for a hematoma. Recommend
clinical follow-up to document complete resolution. In the absence
of complete resolution, follow-up MRI of the abnormality is
recommended.

## 2019-01-09 ENCOUNTER — Inpatient Hospital Stay: Payer: 59

## 2019-01-09 ENCOUNTER — Inpatient Hospital Stay: Payer: 59 | Admitting: Oncology

## 2019-01-10 ENCOUNTER — Telehealth: Payer: Self-pay | Admitting: Oncology

## 2019-01-10 NOTE — Telephone Encounter (Signed)
Returned call re rescheduling. Left message for patient to call re when when she will be able to come in for appointment.

## 2019-02-07 ENCOUNTER — Ambulatory Visit
Admission: RE | Admit: 2019-02-07 | Discharge: 2019-02-07 | Disposition: A | Discharge status: Home / Self Care | Payer: 59 | Source: Ambulatory Visit | Attending: Nurse Practitioner | Admitting: Nurse Practitioner

## 2019-02-07 DIAGNOSIS — Z13820 Encounter for screening for osteoporosis: Secondary | ICD-10-CM

## 2019-07-07 ENCOUNTER — Other Ambulatory Visit: Payer: Self-pay

## 2019-07-07 DIAGNOSIS — Z20822 Contact with and (suspected) exposure to covid-19: Secondary | ICD-10-CM

## 2019-07-08 LAB — NOVEL CORONAVIRUS, NAA: SARS-CoV-2, NAA: NOT DETECTED

## 2019-08-28 ENCOUNTER — Other Ambulatory Visit: Payer: Self-pay

## 2019-11-09 ENCOUNTER — Telehealth (INDEPENDENT_AMBULATORY_CARE_PROVIDER_SITE_OTHER): Payer: No Typology Code available for payment source | Admitting: Cardiovascular Disease

## 2019-11-09 ENCOUNTER — Encounter: Payer: Self-pay | Admitting: Cardiovascular Disease

## 2019-11-09 ENCOUNTER — Telehealth: Payer: Self-pay | Admitting: *Deleted

## 2019-11-09 DIAGNOSIS — I5030 Unspecified diastolic (congestive) heart failure: Secondary | ICD-10-CM

## 2019-11-09 DIAGNOSIS — Z6841 Body Mass Index (BMI) 40.0 and over, adult: Secondary | ICD-10-CM

## 2019-11-09 DIAGNOSIS — I110 Hypertensive heart disease with heart failure: Secondary | ICD-10-CM | POA: Diagnosis not present

## 2019-11-09 DIAGNOSIS — E785 Hyperlipidemia, unspecified: Secondary | ICD-10-CM | POA: Diagnosis not present

## 2019-11-09 DIAGNOSIS — E669 Obesity, unspecified: Secondary | ICD-10-CM

## 2019-11-09 DIAGNOSIS — E119 Type 2 diabetes mellitus without complications: Secondary | ICD-10-CM | POA: Diagnosis not present

## 2019-11-09 NOTE — Patient Instructions (Signed)

## 2019-11-09 NOTE — Telephone Encounter (Signed)
  Patient Consent for Virtual Visit         Cynthia Walsh has provided verbal consent on 11/09/2019 for a virtual visit (video or telephone).   CONSENT FOR VIRTUAL VISIT FOR:  Cynthia Walsh  By participating in this virtual visit I agree to the following:  I hereby voluntarily request, consent and authorize [place] and its employed or contracted physicians, physician assistants, nurse practitioners or other licensed health care professionals (the Practitioner), to provide me with telemedicine health care services (the "Services") as deemed necessary by the treating Practitioner. I acknowledge and consent to receive the Services by the Practitioner via telemedicine. I understand that the telemedicine visit will involve communicating with the Practitioner through live audiovisual communication technology and the disclosure of certain medical information by electronic transmission. I acknowledge that I have been given the opportunity to request an in-person assessment or other available alternative prior to the telemedicine visit and am voluntarily participating in the telemedicine visit.  I understand that I have the right to withhold or withdraw my consent to the use of telemedicine in the course of my care at any time, without affecting my right to future care or treatment, and that the Practitioner or I may terminate the telemedicine visit at any time. I understand that I have the right to inspect all information obtained and/or recorded in the course of the telemedicine visit and may receive copies of available information for a reasonable fee.  I understand that some of the potential risks of receiving the Services via telemedicine include:  Marland Kitchen Delay or interruption in medical evaluation due to technological equipment failure or disruption; . Information transmitted may not be sufficient (e.g. poor resolution of images) to allow for appropriate medical decision making by the  Practitioner; and/or  . In rare instances, security protocols could fail, causing a breach of personal health information.  Furthermore, I acknowledge that it is my responsibility to provide information about my medical history, conditions and care that is complete and accurate to the best of my ability. I acknowledge that Practitioner's advice, recommendations, and/or decision may be based on factors not within their control, such as incomplete or inaccurate data provided by me or distortions of diagnostic images or specimens that may result from electronic transmissions. I understand that the practice of medicine is not an exact science and that Practitioner makes no warranties or guarantees regarding treatment outcomes. I acknowledge that a copy of this consent can be made available to me via my patient portal ([facility]), or I can request a printed copy by calling the office of [facility].    I understand that my insurance will be billed for this visit.   I have read or had this consent read to me. . I understand the contents of this consent, which adequately explains the benefits and risks of the Services being provided via telemedicine.  . I have been provided ample opportunity to ask questions regarding this consent and the Services and have had my questions answered to my satisfaction. . I give my informed consent for the services to be provided through the use of telemedicine in my medical care

## 2019-11-09 NOTE — Progress Notes (Signed)
Virtual Visit via Telephone Note   This visit type was conducted due to national recommendations for restrictions regarding the COVID-19 Pandemic (e.g. social distancing) in an effort to limit this patient's exposure and mitigate transmission in our community.  Due to her co-morbid illnesses, this patient is at least at moderate risk for complications without adequate follow up.  This format is felt to be most appropriate for this patient at this time.  The patient did not have access to video technology/had technical difficulties with video requiring transitioning to audio format only (telephone).  All issues noted in this document were discussed and addressed.  No physical exam could be performed with this format.  Please refer to the patient's chart for her  consent to telehealth for [facility].   The patient was identified using 2 identifiers.  Date:  11/09/2019   ID:  Cynthia Walsh, DOB 10-07-1960, MRN [id number]  Patient Location: Other:  work Provider Location: Home  PCP:  Dianna Rossetti, NP (Inactive)  Cardiologist:  No primary care provider on file. Jonai Weyland Electrophysiologist:  None   Evaluation Performed:  Follow-Up Visit  Chief Complaint:  HTN, HLP  History of Present Illness:    Cynthia Walsh is a 59 y.o. female with morbid obesity, type 2 diabetes mellitus, hypertension, mixed hyperlipidemia and echo evidence of left ventricular diastolic dysfunction.  She has not had overt congestive heart failure, CAD or PAD findings.  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  She has done well during the last year from a cardiovascular point of view.  She has not been troubled by edema.  Blood pressure is well controlled on ACE inhibitor therapy and she takes hydrochlorothiazide as needed, no more than once a week to keep the swelling from recurring.  She has not had shortness of breath at rest or with activity, although she is quite  sedentary.  She denies dizziness, syncope, palpitations or focal neurological events and has not had intermittent claudication.  Her typical blood pressure is in the 120s/70s range, sometimes as low as 100/70.  Her most recent hemoglobin A1c was 6.6% in October and she has an appointment with her endocrinologist next week.  Her most recent lipid profile did show a slight increase in her total cholesterol 269 and she continues to have a very low HDL of 24.  Her triglycerides were borderline at 159.  Previously her LDL was as low as 74 in June 2020.  She thinks she may be occasionally missing some doses of statin.  She does not smoke.  The patient does not have symptoms concerning for COVID-19 infection (fever, chills, cough, or new shortness of breath).    Past Medical History:  Diagnosis Date  . Anemia   . Bilateral lower extremity edema   . CHF (congestive heart failure) (HCC)    due to amlodipine  . Chronic urticaria   . Depression   . Diabetes mellitus without complication ([place])    pre diabetic  . Environmental and seasonal allergies   . Fibroid   . Full body hives    hx 2012  . GERD (gastroesophageal reflux disease)   . Grade I diastolic dysfunction   . History of palpitations   . Hyperlipidemia   . Hypertension   . Irregular bleeding   . Mild concentric left ventricular hypertrophy (LVH)   . Obesity   . PAF (paroxysmal atrial fibrillation) ([place])    per patient, states she  goes in and out quickly  . 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 Thyroid nodule    right side small  . UTI (urinary tract infection)    history of UTI    Past Surgical History:  Procedure Laterality Date  . COLONOSCOPY    . DILATATION & CURRETTAGE/HYSTEROSCOPY WITH RESECTOCOPE N/A 06/15/2013   Procedure: [place];  Surgeon: Ena Dawley, MD;  Location: [facility];  Service: Gynecology;  Laterality: N/A;  . MASS EXCISION Right     bicep muscle  .  TONSILLECTOMY AND ADENOIDECTOMY    . TUBAL LIGATION       Current Meds  Medication Sig  . atorvastatin (LIPITOR) 40 MG tablet Take 40 mg by mouth daily.  . benazepril (LOTENSIN) 40 MG tablet Take 40 mg by mouth daily.   . calcium citrate-vitamin D 200-200 MG-UNIT TABS Take 1 tablet by mouth daily.  . cetirizine (ZYRTEC) 10 MG tablet Take 20 mg by mouth. 3-4 times daily.  . diphenhydrAMINE (BENADRYL) 2 % cream Apply topically 3 (three) times daily as needed for itching.  Marland Kitchen doxepin (SINEQUAN) 10 MG capsule Take 30 mg by mouth at bedtime as needed (allergies).   . Dulaglutide (TRULICITY) 1.5 0000000 SOPN once a week.  Marland Kitchen EPINEPHrine (EPIPEN 2-PAK) 0.3 mg/0.3 mL IJ SOAJ injection Inject 0.3 mLs (0.3 mg total) into the muscle once.  . escitalopram (LEXAPRO) 20 MG tablet Take 40 mg by mouth daily.   . hydrochlorothiazide (MICROZIDE) 12.5 MG capsule TAKE 1 CAPSULE BY MOUTH ONCE DAILY  . hydrocortisone cream 1 % Apply 1 application topically as needed for itching.  . hydrOXYzine (ATARAX/VISTARIL) 25 MG tablet Take 50 mg by mouth every evening.   . metFORMIN (GLUCOPHAGE-XR) 500 MG 24 hr tablet Take 1,000 mg by mouth at bedtime.   . mometasone (ELOCON) 0.1 % cream APPLY TO AFFECTED AREA DAILY AS NEEDED  . Multiple Vitamin (MULTIVITAMIN WITH MINERALS) TABS Take 1 tablet by mouth daily.     Allergies:   Ceclor [cefaclor], Other, Sulfa drugs cross reactors, Banana flavor, Liraglutide, Minocycline, Peanut-containing drug products, Shellfish allergy, Sulfa antibiotics, Amlodipine, and Fruit & vegetable daily [nutritional supplements]   Social History   Tobacco Use  . Smoking status: Never Smoker  . Smokeless tobacco: Never Used  Substance Use Topics  . Alcohol use: Yes    Comment: occ. wine  . Drug use: No     Family Hx: The patient's family history includes Alzheimer's disease in her father and mother.  ROS:   Please see the history of present illness.     All other systems reviewed and  are negative.   Prior CV studies:   The following studies were reviewed today:  Labs from August 14, 2019  Labs/Other Tests and Data Reviewed:    EKG:  No ECG reviewed.  Recent Labs: No results found for requested labs within last 8760 hours.   Recent Lipid Panel No results found for: CHOL, TRIG, HDL, CHOLHDL, LDLCALC, LDLDIRECT  Wt Readings from Last 3 Encounters:  11/09/19 253 lb (114.8 kg)  04/24/18 267 lb 9.6 oz (121.4 kg)  01/19/18 266 lb 4.8 oz (120.8 kg)     Objective:    Vital Signs:  BP 136/86   Pulse 71   Ht 5\' 2"  (1.575 m)   Wt 253 lb (114.8 kg)   LMP 06/11/2013 Comment: PMB  BMI 46.27 kg/m    VITAL SIGNS:  reviewed Unable to examine  ASSESSMENT &  PLAN:    1. HTN: Borderline elevated today, but usually with excellent control. 2. DM: Well-controlled on oral antidiabetics. 3. HLP: Very low HDL cholesterol and borderline elevated triglycerides consistent with insulin resistance/metabolic syndrome.  LDL remains in acceptable range (less than 100), but has increased some from last year.  Encouraged better compliance with daily statin. 4. Obesity: She has lost about 12 pounds in the last 3 months and is conscientiously trying to watch her diet.  Encourage more physical activity. 5. Diastolic dysfunction: Her echocardiogram confirmed mild LVH and mildly abnormal diastolic parameters (although the report states normal diastolic function, the mitral annulus diastolic velocities are mildly reduced).  She does not have clinical heart failure.. 6. Palpitations: None recently.  COVID-19 Education: The signs and symptoms of COVID-19 were discussed with the patient and how to seek care for testing (follow up with PCP or arrange E-visit).  The importance of social distancing was discussed today.  Time:   Today, I have spent 16 minutes with the patient with telehealth technology discussing the above problems.     Medication Adjustments/Labs and Tests Ordered: Current  medicines are reviewed at length with the patient today.  Concerns regarding medicines are outlined above.   Tests Ordered: No orders of the defined types were placed in this encounter.   Medication Changes: No orders of the defined types were placed in this encounter.   Follow Up:  In Person 1 year  Signed, Sanda Klein, MD  11/09/2019 8:08 AM    [place]

## 2020-05-01 ENCOUNTER — Telehealth: Payer: Self-pay | Admitting: Oncology

## 2020-05-01 NOTE — Telephone Encounter (Signed)
Release: [id number] Faxed pt's medical records to the [facility] @ [phone]

## 2021-11-24 ENCOUNTER — Inpatient Hospital Stay: Payer: 59

## 2021-11-25 ENCOUNTER — Inpatient Hospital Stay: Payer: 59 | Attending: Oncology | Admitting: Oncology

## 2021-11-25 VITALS — BP 135/79 | HR 82 | Temp 98.1°F | Resp 18 | Ht 62.0 in | Wt 248.0 lb

## 2021-11-25 DIAGNOSIS — L500 Allergic urticaria: Secondary | ICD-10-CM

## 2021-11-25 DIAGNOSIS — R2231 Localized swelling, mass and lump, right upper limb: Secondary | ICD-10-CM

## 2021-11-25 NOTE — Progress Notes (Signed)
?[facility] ?New Patient Consult ? ? ?Requesting MD: ?Charlane Ferretti, Md ?[address] ?[place],  [place] [address] ? ? ?VEDIKA DUMLAO ?61 y.o.  ?[birth year] ? ?  ?Reason for Consult: Rosai-Dorfman syndrome ? ? 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  She was last seen by Dr. Katina Dung at [facility] in January 2020.  He recommended observation.  He did not recommend evaluation for lymphoma. ? ?She reports a nodular lesion inferior to the right arm scar for the past year.  The lesion has not changed.  No pain. ?She feels well.  She has intermittent pruritus and urticaria.  Ms. Flurry is referred to the cancer center for continued follow-up and management of the Rosai-Dorfman syndrome. ? ?Past Medical History:  ?Diagnosis Date  ? Anemia   ? Bilateral lower extremity edema   ? CHF (congestive heart failure) ([place])   ? due to amlodipine  ? Chronic urticaria   ? Depression   ? Diabetes mellitus without complication ([place])   ? pre diabetic  ? Environmental and seasonal allergies   ? Fibroid   ? Full body hives   ? hx 2012  ? GERD (gastroesophageal reflux disease)   ? Grade I diastolic dysfunction   ? History of palpitations   ? Hyperlipidemia   ? Hypertension   ? Irregular bleeding   ? Mild concentric left ventricular hypertrophy (LVH)   ? Obesity   ? PAF (paroxysmal atrial fibrillation) ([place])   ? per patient, states she goes in and out quickly  ? 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 R bicep muscle  ? Thyroid nodule   ? right side small  ? UTI (urinary tract infection)   ? history of UTI   ?  .  G3, P0, 3 miscarriages, amenorrheic since age 3 ?  .  COVID-13 July 2020 ? ?Past Surgical History:  ?Procedure Laterality Date  ? COLONOSCOPY    ? DILATATION & CURRETTAGE/HYSTEROSCOPY WITH RESECTOCOPE N/A 06/15/2013  ? Procedure: [place];  Surgeon: Ena Dawley, MD;  Location: [facility];   Service: Gynecology;  Laterality: N/A;  ? MASS EXCISION Right   ?  bicep muscle  ? TONSILLECTOMY AND ADENOIDECTOMY    ? TUBAL LIGATION    ? ? ?Medications: Reviewed ? ?Allergies:  ?Allergies  ?Allergen Reactions  ? Ceclor [Cefaclor] Nausea And Vomiting  ? Other Itching, Rash and Swelling  ?  Most fruits   ? Sulfa Drugs Cross Reactors Itching, Swelling and Rash  ? Banana Hives  ? Banana Flavor Hives  ? Liraglutide Other (See Comments)  ?  Other reaction(s): rash ?Other reaction(s): Other (See Comments)  ? Minocycline Itching  ?  Other reaction(s): unknown   ? Peanut-Containing Drug Products   ? Shellfish Allergy   ? Sulfa Antibiotics Itching  ? Amlodipine Palpitations  ? Fruit & Vegetable Daily [Nutritional Supplements] Rash  ? ? ?Family history: Father had prostate cancer.  A maternal aunt had breast cancer. ? ?Social History:  ? ?She lives with her husband in [place].  She is retired Marine scientist.  No transfusion history.  No risk factor for HIV or hepatitis.  No cigarette use.  Social alcohol use. ? ?ROS:  ? ?Positives include: Occasional gastroesophageal reflux, sinus infection 1 month ago treated with azithromycin, "lump "at the right arm for the past year, pruritus, intermittent hives over the arms ? ?A complete  ROS was otherwise negative. ? ?Physical Exam: ? ?Blood pressure 135/79, pulse 82, temperature 98.1 ?F (36.7 ?C), temperature source Oral, resp. rate 18, height '5\' 2"'$  (1.575 m), weight 248 lb (112.5 kg), last menstrual period 06/11/2013, SpO2 99 %. ? ?HEENT: Oropharynx without visible mass, neck without mass ?Lungs: Clear bilaterally ?Cardiac: Regular rate and rhythm ?Abdomen: No hepatosplenomegaly  ?Vascular: No leg edema ?Lymph nodes: No cervical, supraclavicular, axillary, or inguinal nodes ?Neurologic: Alert and oriented, the motor exam appears intact in the upper and lower extremities bilaterally ?Skin: A few centimeters inferior to the lowest right arm scar there is a 2-2.5 cm nodular lesion in the  deep subcutaneous tissue with overlying firm nodularity ?Musculoskeletal: No spine tenderness ? ? ?LAB: ? ?CBC ? ?Lab Results  ?Component Value Date  ? WBC 6.7 01/19/2016  ? HGB 13.6 01/19/2016  ? HCT 41.2 01/19/2016  ? MCV 78.5 (L) 01/19/2016  ? PLT 338 01/19/2016  ? NEUTROABS 2,814 01/19/2016  ?  ? ?  ? ?CMP  ?Lab Results  ?Component Value Date  ? NA 138 04/18/2017  ? K 4.0 04/18/2017  ? CL 102 01/19/2016  ? CO2 25 04/18/2017  ? GLUCOSE 91 04/18/2017  ? BUN 11.0 04/18/2017  ? CREATININE 1.0 04/18/2017  ? CALCIUM 10.3 04/18/2017  ? PROT 8.8 (H) 04/18/2017  ? ALBUMIN 4.0 04/18/2017  ? AST 19 04/18/2017  ? ALT 20 04/18/2017  ? ALKPHOS 108 04/18/2017  ? BILITOT 0.32 04/18/2017  ? GFRNONAA 83 (L) 06/15/2013  ? GFRAA >90 06/15/2013  ? ? ? ? ? ? ?Assessment/Plan:  ?Rosai-Dorfman disease ?Excisional biopsy of a right upper arm mass 03/03/2017 with histiocytes and a background of lymphocyte/plasma cells ?Excisional biopsy of right upper extremity mass (superior to the 03/03/2017 scar) consistent with Rosai-Dorfman disease ?Deep cutaneous mass at the right upper arm, inferior to the biopsy scar 11/25/2021 ?  ?2.   multiple allergies, history of urticaria ?  ?3.   Diabetes ?  ?4.   Hypertension ? ?5.   Chronic pruritus ? ?6.   History of CHF-amlodipine related? ? ?7.    Paroxysmal atrial fibrillation ? ?8.   Hyperlipidemia ? ?Disposition:  ? ?Ms. Mcghee has a history of Rosai-Dorfman disease diagnosed on biopsy of a right arm mass in 2018.  She is asymptomatic and has been followed with observation.  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  There is no evidence of an associated systemic illness unless the pruritus and urticaria are related to the Rosai-Dorfman. ? ?She has a firm deep cutaneous lesion at the right arm.  This could represent a local recurrence of the mass excised in 2018. ? ?I will refer her back to Dr. Katina Dung at [facility] to consider a repeat biopsy and for recommendations regarding  management. ? ?She will return for an office visit 01/06/2022. ? ?Betsy Coder, MD  ?11/25/2021, 10:56 AM ? ? ?

## 2021-12-14 ENCOUNTER — Other Ambulatory Visit: Payer: Self-pay | Admitting: Internal Medicine

## 2021-12-14 DIAGNOSIS — Z1231 Encounter for screening mammogram for malignant neoplasm of breast: Secondary | ICD-10-CM

## 2022-01-06 ENCOUNTER — Ambulatory Visit: Payer: No Typology Code available for payment source | Admitting: Oncology

## 2022-01-06 ENCOUNTER — Other Ambulatory Visit: Payer: No Typology Code available for payment source

## 2022-03-04 ENCOUNTER — Inpatient Hospital Stay: Payer: Commercial Managed Care - HMO | Attending: Oncology

## 2022-03-04 ENCOUNTER — Inpatient Hospital Stay (HOSPITAL_BASED_OUTPATIENT_CLINIC_OR_DEPARTMENT_OTHER): Payer: Commercial Managed Care - HMO | Admitting: Oncology

## 2022-03-04 VITALS — BP 143/81 | HR 87 | Temp 98.2°F | Resp 18 | Ht 62.0 in | Wt 248.2 lb

## 2022-03-04 DIAGNOSIS — R2231 Localized swelling, mass and lump, right upper limb: Secondary | ICD-10-CM | POA: Insufficient documentation

## 2022-03-04 DIAGNOSIS — E119 Type 2 diabetes mellitus without complications: Secondary | ICD-10-CM | POA: Insufficient documentation

## 2022-03-04 DIAGNOSIS — I10 Essential (primary) hypertension: Secondary | ICD-10-CM | POA: Insufficient documentation

## 2022-03-04 DIAGNOSIS — L500 Allergic urticaria: Secondary | ICD-10-CM

## 2022-03-04 DIAGNOSIS — R2241 Localized swelling, mass and lump, right lower limb: Secondary | ICD-10-CM | POA: Insufficient documentation

## 2022-03-04 DIAGNOSIS — D763 Other histiocytosis syndromes: Secondary | ICD-10-CM | POA: Insufficient documentation

## 2022-03-04 DIAGNOSIS — I480 Paroxysmal atrial fibrillation: Secondary | ICD-10-CM | POA: Insufficient documentation

## 2022-03-04 DIAGNOSIS — E785 Hyperlipidemia, unspecified: Secondary | ICD-10-CM | POA: Insufficient documentation

## 2022-03-04 LAB — CBC WITH DIFFERENTIAL (CANCER CENTER ONLY)
Abs Immature Granulocytes: 0.01 10*3/uL (ref 0.00–0.07)
Basophils Absolute: 0.1 10*3/uL (ref 0.0–0.1)
Basophils Relative: 1 %
Eosinophils Absolute: 0.2 10*3/uL (ref 0.0–0.5)
Eosinophils Relative: 3 %
HCT: 39.7 % (ref 36.0–46.0)
Hemoglobin: 12.7 g/dL (ref 12.0–15.0)
Immature Granulocytes: 0 %
Lymphocytes Relative: 43 %
Lymphs Abs: 2.6 10*3/uL (ref 0.7–4.0)
MCH: 25.5 pg — ABNORMAL LOW (ref 26.0–34.0)
MCHC: 32 g/dL (ref 30.0–36.0)
MCV: 79.7 fL — ABNORMAL LOW (ref 80.0–100.0)
Monocytes Absolute: 0.5 10*3/uL (ref 0.1–1.0)
Monocytes Relative: 9 %
Neutro Abs: 2.6 10*3/uL (ref 1.7–7.7)
Neutrophils Relative %: 44 %
Platelet Count: 340 10*3/uL (ref 150–400)
RBC: 4.98 MIL/uL (ref 3.87–5.11)
RDW: 13.3 % (ref 11.5–15.5)
WBC Count: 5.9 10*3/uL (ref 4.0–10.5)
nRBC: 0 % (ref 0.0–0.2)

## 2022-03-04 LAB — LACTATE DEHYDROGENASE: LDH: 138 U/L (ref 98–192)

## 2022-03-04 LAB — CMP (CANCER CENTER ONLY)
ALT: 14 U/L (ref 0–44)
AST: 16 U/L (ref 15–41)
Albumin: 3.9 g/dL (ref 3.5–5.0)
Alkaline Phosphatase: 76 U/L (ref 38–126)
Anion gap: 8 (ref 5–15)
BUN: 12 mg/dL (ref 8–23)
CO2: 26 mmol/L (ref 22–32)
Calcium: 9.6 mg/dL (ref 8.9–10.3)
Chloride: 102 mmol/L (ref 98–111)
Creatinine: 1.05 mg/dL — ABNORMAL HIGH (ref 0.44–1.00)
GFR, Estimated: >60 mL/min (ref >60)
Glucose, Bld: 92 mg/dL (ref 70–99)
Potassium: 4.2 mmol/L (ref 3.5–5.1)
Sodium: 136 mmol/L (ref 135–145)
Total Bilirubin: 0.3 mg/dL (ref 0.3–1.2)
Total Protein: 8.5 g/dL — ABNORMAL HIGH (ref 6.5–8.1)

## 2022-03-04 LAB — FERRITIN: Ferritin: 118 ng/mL (ref 11–307)

## 2022-03-04 NOTE — Progress Notes (Signed)
  [facility] OFFICE PROGRESS NOTE   Diagnosis: Rosai-Dorfman  INTERVAL HISTORY:   Cynthia Walsh returns as scheduled.  She feels well.  Good appetite.  No fever or night sweats.  No palpable lymph nodes.  She recently started Ozempic for diabetes and weight loss. There is a persistent mass at the right upper arm.  She developed a new lesion at the dorsum of the left foot approximately 2 months ago.  These lesions are not painful. She decided against going back to [facility].  She is a concerned about the cost of an appointment at [facility]. She reports occasional bleeding with a firm stool.  She is scheduled to see Dr. Collene Mares today. Objective:  Vital signs in last 24 hours:  Blood pressure (!) 143/81, pulse 87, temperature 98.2 F (36.8 C), temperature source Oral, resp. rate 18, height '5\' 2"'$  (1.575 m), weight 248 lb 3.2 oz (112.6 kg), last menstrual period 06/11/2013, SpO2 98 %.    Lymphatics: No cervical, supraclavicular, axillary, or inguinal nodes Resp: Lungs clear bilaterally Cardio: Regular rate and rhythm GI: No hepatosplenomegaly Vascular: No leg edema  Skin: There is a 1.5 cm deep cutaneous mass inferior to the lower scar at the right upper arm.  Similar lesion measuring approximately 1 cm at the distal medial aspect of the dorsum of the right foot.   Lab Results:  Lab Results  Component Value Date   WBC 5.9 03/04/2022   HGB 12.7 03/04/2022   HCT 39.7 03/04/2022   MCV 79.7 (L) 03/04/2022   PLT 340 03/04/2022   NEUTROABS 2.6 03/04/2022    CMP  Lab Results  Component Value Date   NA 138 04/18/2017   K 4.0 04/18/2017   CL 102 01/19/2016   CO2 25 04/18/2017   GLUCOSE 91 04/18/2017   BUN 11.0 04/18/2017   CREATININE 1.0 04/18/2017   CALCIUM 10.3 04/18/2017   PROT 8.8 (H) 04/18/2017   ALBUMIN 4.0 04/18/2017   AST 19 04/18/2017   ALT 20 04/18/2017   ALKPHOS 108 04/18/2017   BILITOT 0.32 04/18/2017   GFRNONAA 83 (L) 06/15/2013   GFRAA >90 06/15/2013     Medications: I have reviewed the patient's current medications.   Assessment/Plan: Rosai-Dorfman disease Excisional biopsy of a right upper arm mass 03/03/2017 with histiocytes and a background of lymphocyte/plasma cells Excisional biopsy of right upper extremity mass (superior to the 03/03/2017 scar) consistent with Rosai-Dorfman disease Deep cutaneous mass at the right upper arm, inferior to the biopsy scar 11/25/2021 Cutaneous mass at the dorsum of the right foot 03/04/2022   2.   multiple allergies, history of urticaria   3.   Diabetes   4.   Hypertension  5.   Chronic pruritus  6.   History of CHF-amlodipine related?  7.    Paroxysmal atrial fibrillation  8.   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  She has cutaneous involvement.  She is asymptomatic other than having palpable lesions at the right arm and left foot.  There is no evidence of lymphoma.  The plan is to continue observation.  She will see Dr. Collene Mares to evaluate rectal bleeding.  Ms. Beane will return for an office visit in 4 months.  We will consider a repeat biopsy and treatments if she develops symptoms or multiple new skin lesions.   Betsy Coder, MD  03/04/2022  8:24 AM

## 2022-03-05 ENCOUNTER — Other Ambulatory Visit (HOSPITAL_COMMUNITY): Payer: Self-pay | Admitting: Gastroenterology

## 2022-03-05 ENCOUNTER — Other Ambulatory Visit: Payer: Self-pay | Admitting: Gastroenterology

## 2022-03-05 DIAGNOSIS — R1011 Right upper quadrant pain: Secondary | ICD-10-CM

## 2022-03-16 ENCOUNTER — Encounter (HOSPITAL_COMMUNITY): Payer: Self-pay

## 2022-03-16 ENCOUNTER — Ambulatory Visit (HOSPITAL_COMMUNITY): Payer: Commercial Managed Care - HMO

## 2022-03-16 ENCOUNTER — Encounter (HOSPITAL_COMMUNITY): Payer: Commercial Managed Care - HMO

## 2022-07-02 ENCOUNTER — Inpatient Hospital Stay: Payer: Commercial Managed Care - HMO | Admitting: Oncology

## 2022-09-03 ENCOUNTER — Inpatient Hospital Stay: Payer: Commercial Managed Care - HMO | Admitting: Oncology

## 2022-10-25 ENCOUNTER — Inpatient Hospital Stay: Payer: Self-pay | Admitting: Oncology

## 2022-11-19 ENCOUNTER — Encounter: Payer: Self-pay | Admitting: *Deleted

## 2022-11-19 ENCOUNTER — Inpatient Hospital Stay: Payer: 59 | Attending: Oncology | Admitting: Oncology

## 2022-11-19 ENCOUNTER — Encounter: Payer: Self-pay | Admitting: Oncology

## 2022-11-19 VITALS — BP 136/76 | HR 81 | Temp 98.1°F | Resp 18 | Ht 62.0 in | Wt 257.0 lb

## 2022-11-19 DIAGNOSIS — D763 Other histiocytosis syndromes: Secondary | ICD-10-CM | POA: Diagnosis not present

## 2022-11-19 DIAGNOSIS — I480 Paroxysmal atrial fibrillation: Secondary | ICD-10-CM | POA: Diagnosis not present

## 2022-11-19 DIAGNOSIS — E785 Hyperlipidemia, unspecified: Secondary | ICD-10-CM | POA: Insufficient documentation

## 2022-11-19 DIAGNOSIS — L299 Pruritus, unspecified: Secondary | ICD-10-CM | POA: Diagnosis not present

## 2022-11-19 DIAGNOSIS — I10 Essential (primary) hypertension: Secondary | ICD-10-CM | POA: Diagnosis not present

## 2022-11-19 DIAGNOSIS — E119 Type 2 diabetes mellitus without complications: Secondary | ICD-10-CM | POA: Diagnosis not present

## 2022-11-19 NOTE — Progress Notes (Signed)
Referral faxed to Dr Leotis Pain for Cynthia Walsh at [phone].  She will be scheduled as a new patient since it has been greater than 3 years since last seen by Dr Leotis Pain.

## 2022-11-19 NOTE — Progress Notes (Signed)
  [facility] OFFICE PROGRESS NOTE   Diagnosis: Rosai-Dorfman disease  INTERVAL HISTORY:   Cynthia Walsh returns as scheduled.  She generally feels well.  No fever.  Good appetite.  No new cutaneous lesions.  She complains of diffuse pruritus.  She reports a history of pruritus throughout her life.  She has taken multiple medications without relief.  Hydrocortisone cream helps.  Objective:  Vital signs in last 24 hours:  Blood pressure 136/76, pulse 81, temperature 98.1 F (36.7 C), resp. rate 18, height 5\' 2"  (1.575 m), weight 257 lb (116.6 kg), last menstrual period 06/11/2013, SpO2 98 %.   Lymphatics: No cervical, supraclavicular, axillary, or inguinal nodes Resp: Distant breath sounds, mild inspiratory/expiratory wheeze at the upper chest bilaterally, no respiratory distress Cardio: Regular rate and rhythm GI: No hepatosplenomegaly Vascular: No leg edema  Skin: 0.5 cm mobile cutaneous nodule inferior to the lower right arm scar, 1 cm mobile cutaneous nodule at the dorsum of the right foot  Lab Results:  Lab Results  Component Value Date   WBC 5.9 03/04/2022   HGB 12.7 03/04/2022   HCT 39.7 03/04/2022   MCV 79.7 (L) 03/04/2022   PLT 340 03/04/2022   NEUTROABS 2.6 03/04/2022    CMP  Lab Results  Component Value Date   NA 136 03/04/2022   K 4.2 03/04/2022   CL 102 03/04/2022   CO2 26 03/04/2022   GLUCOSE 92 03/04/2022   BUN 12 03/04/2022   CREATININE 1.05 (H) 03/04/2022   CALCIUM 9.6 03/04/2022   PROT 8.5 (H) 03/04/2022   ALBUMIN 3.9 03/04/2022   AST 16 03/04/2022   ALT 14 03/04/2022   ALKPHOS 76 03/04/2022   BILITOT 0.3 03/04/2022   GFRNONAA >60 03/04/2022   GFRAA >90 06/15/2013     Medications: I have reviewed the patient's current medications.   Assessment/Plan: Rosai-Dorfman disease Excisional biopsy of a right upper arm mass 03/03/2017 with histiocytes and a background of lymphocyte/plasma cells Excisional biopsy of right upper  extremity mass (superior to the 03/03/2017 scar) consistent with Rosai-Dorfman disease Deep cutaneous mass at the right upper arm, inferior to the biopsy scar 11/25/2021 Cutaneous mass at the dorsum of the right foot 03/04/2022, 11/19/2022   2.   multiple allergies, history of urticaria   3.   Diabetes   4.   Hypertension  5.   Chronic pruritus  6.   History of CHF-amlodipine related?  7.    Paroxysmal atrial fibrillation  8.   Hyperlipidemia      Disposition: Cynthia Walsh has Rosai-Dorfman syndrome.  Her overall status appears unchanged.  No new palpable skin lesions.  I doubt the pruritus is related to the Rosai-Dorfman syndrome, but this is possible.  She will schedule appoint with Dr. Leotis Pain to get his opinion regarding the indication for further staging and treatment.  She would like to schedule this appointment for late May.  She will return for an office visit in approximately 2 months.  Thornton Papas, MD  11/19/2022  8:35 AM

## 2023-01-01 LAB — HEMOGLOBIN A1C: Hemoglobin A1C: 6.3

## 2023-01-01 LAB — GLUCOSE, POCT (MANUAL RESULT ENTRY): Glucose Fasting, POC: 115 mg/dL — AB (ref 70–99)

## 2023-01-01 NOTE — Progress Notes (Signed)
Please follow up on PCP need. Pt to fo to [employer] for Food needs. No other SDOH needs at tthis time.

## 2023-01-14 ENCOUNTER — Encounter: Payer: Self-pay | Admitting: *Deleted

## 2023-01-14 ENCOUNTER — Inpatient Hospital Stay: Payer: 59 | Attending: Oncology | Admitting: Oncology

## 2023-01-14 NOTE — Progress Notes (Signed)
"  No show" for f/u today. Scheduling message sent to reschedule for ~ 1 month.

## 2023-01-17 ENCOUNTER — Telehealth: Payer: Self-pay | Admitting: Oncology

## 2023-01-17 NOTE — Telephone Encounter (Signed)
Spoke with patient confirming upcoming appointment  

## 2023-02-07 ENCOUNTER — Encounter: Payer: Self-pay | Admitting: *Deleted

## 2023-02-07 NOTE — Progress Notes (Signed)
Pt attended 01/01/23 screening event where her b/p was 145/83 on recheck and her blood sugar was 115 and her A1C was 6.3. At the event, the pt did not indicated her PCP's name and did identify a food insecurity and was given [employer] resources. During the event follow-up call, the pt verified her PCP is Cynthia Walsh at [facility] [place] and that she had already f/u with Cynthia Walsh about her event findings. Pt stated her b/p had been "all right but she put me on some cholesterol medicine and increased my Trulicity." Pt stated she no longer had a food insecurity and did not need additional food pantry resources currently. No additional health equity team support indicated at this time.

## 2023-02-11 ENCOUNTER — Telehealth: Payer: Self-pay

## 2023-02-11 NOTE — Telephone Encounter (Signed)
Called pt on 02/11/23 to see if she would like to r/s appt from 02/17/23. Patient does not wish to reschedule appointment at this time.

## 2023-02-17 ENCOUNTER — Inpatient Hospital Stay: Payer: Self-pay | Admitting: Oncology

## 2023-06-07 DIAGNOSIS — F338 Other recurrent depressive disorders: Secondary | ICD-10-CM | POA: Diagnosis not present

## 2023-06-07 DIAGNOSIS — F411 Generalized anxiety disorder: Secondary | ICD-10-CM | POA: Diagnosis not present

## 2023-06-07 DIAGNOSIS — F4312 Post-traumatic stress disorder, chronic: Secondary | ICD-10-CM | POA: Diagnosis not present

## 2023-06-28 DIAGNOSIS — F4312 Post-traumatic stress disorder, chronic: Secondary | ICD-10-CM | POA: Diagnosis not present

## 2023-06-28 DIAGNOSIS — F411 Generalized anxiety disorder: Secondary | ICD-10-CM | POA: Diagnosis not present

## 2023-06-28 DIAGNOSIS — F338 Other recurrent depressive disorders: Secondary | ICD-10-CM | POA: Diagnosis not present

## 2023-06-30 DIAGNOSIS — Z008 Encounter for other general examination: Secondary | ICD-10-CM | POA: Diagnosis not present

## 2023-07-13 DIAGNOSIS — F4312 Post-traumatic stress disorder, chronic: Secondary | ICD-10-CM | POA: Diagnosis not present

## 2023-07-13 DIAGNOSIS — F338 Other recurrent depressive disorders: Secondary | ICD-10-CM | POA: Diagnosis not present

## 2023-07-13 DIAGNOSIS — F411 Generalized anxiety disorder: Secondary | ICD-10-CM | POA: Diagnosis not present
